# Patient Record
Sex: Female | Born: 1995 | Race: White | Hispanic: No | Marital: Single | Smoking: Never smoker
Health system: Southern US, Community
[De-identification: ages and names within clinical notes are randomized; demographics above are authoritative.]

## PROBLEM LIST (undated history)

## (undated) ENCOUNTER — Inpatient Hospital Stay: Payer: Self-pay

## (undated) DIAGNOSIS — E669 Obesity, unspecified: Secondary | ICD-10-CM

## (undated) DIAGNOSIS — M25473 Effusion, unspecified ankle: Secondary | ICD-10-CM

## (undated) DIAGNOSIS — L03119 Cellulitis of unspecified part of limb: Secondary | ICD-10-CM

## (undated) DIAGNOSIS — F1910 Other psychoactive substance abuse, uncomplicated: Secondary | ICD-10-CM

## (undated) DIAGNOSIS — R030 Elevated blood-pressure reading, without diagnosis of hypertension: Secondary | ICD-10-CM

---
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
## 2013-02-13 ENCOUNTER — Observation Stay: Payer: Self-pay

## 2013-02-13 LAB — COMPREHENSIVE METABOLIC PANEL
Albumin: 2.3 g/dL — ABNORMAL LOW (ref 3.8–5.6)
Alkaline Phosphatase: 95 U/L (ref 82–169)
Anion Gap: 6 — ABNORMAL LOW (ref 7–16)
BUN: 5 mg/dL — ABNORMAL LOW (ref 9–21)
Calcium, Total: 8.2 mg/dL — ABNORMAL LOW (ref 9.0–10.7)
Chloride: 110 mmol/L — ABNORMAL HIGH (ref 97–107)
Co2: 23 mmol/L (ref 16–25)
Creatinine: 0.52 mg/dL — ABNORMAL LOW (ref 0.60–1.30)
EGFR (African American): >60
Glucose: 91 mg/dL (ref 65–99)
Potassium: 3.2 mmol/L — ABNORMAL LOW (ref 3.3–4.7)
SGOT(AST): 16 U/L (ref 0–26)
SGPT (ALT): 13 U/L (ref 12–78)
Total Protein: 6.4 g/dL (ref 6.4–8.6)

## 2013-02-14 LAB — DIFFERENTIAL
Eosinophil #: 0.2 10*3/uL (ref 0.0–0.7)
Lymphocyte #: 2 10*3/uL (ref 1.0–3.6)
Lymphocyte %: 13.7 %
Neutrophil #: 11.2 10*3/uL — ABNORMAL HIGH (ref 1.4–6.5)

## 2013-02-14 LAB — URINALYSIS, COMPLETE
Blood: NEGATIVE
Glucose,UR: NEGATIVE mg/dL (ref 0–75)
Protein: NEGATIVE
RBC,UR: 13 /HPF (ref 0–5)
Specific Gravity: 1.015 (ref 1.003–1.030)
WBC UR: 43 /HPF (ref 0–5)

## 2013-02-14 LAB — CBC
HCT: 28.9 % — ABNORMAL LOW (ref 35.0–47.0)
MCV: 87 fL (ref 80–100)
RBC: 3.3 10*6/uL — ABNORMAL LOW (ref 3.80–5.20)
RDW: 12.9 % (ref 11.5–14.5)

## 2013-02-16 LAB — URINE CULTURE

## 2013-02-22 LAB — HM HIV SCREENING LAB: HM HIV Screening: NEGATIVE

## 2013-02-26 ENCOUNTER — Observation Stay: Payer: Self-pay | Admitting: Obstetrics & Gynecology

## 2013-02-26 LAB — URINALYSIS, COMPLETE
Bilirubin,UR: NEGATIVE
Glucose,UR: NEGATIVE mg/dL (ref 0–75)
Nitrite: POSITIVE
Ph: 6 (ref 4.5–8.0)
RBC,UR: 4 /HPF (ref 0–5)
Specific Gravity: 1.017 (ref 1.003–1.030)
Squamous Epithelial: 2
WBC UR: 3172 /HPF (ref 0–5)

## 2013-02-27 LAB — GC/CHLAMYDIA PROBE AMP

## 2013-04-15 ENCOUNTER — Observation Stay: Payer: Self-pay

## 2013-04-15 LAB — URINALYSIS, COMPLETE
Ketone: NEGATIVE
Nitrite: NEGATIVE
Ph: 7 (ref 4.5–8.0)
Protein: 100
RBC,UR: 12 /HPF (ref 0–5)
Specific Gravity: 1.009 (ref 1.003–1.030)
Squamous Epithelial: 1

## 2013-04-17 LAB — URINE CULTURE

## 2013-04-23 ENCOUNTER — Observation Stay: Payer: Self-pay | Admitting: Obstetrics & Gynecology

## 2013-04-23 LAB — PIH PROFILE
Anion Gap: 7 (ref 7–16)
BUN: 4 mg/dL — ABNORMAL LOW (ref 7–18)
Creatinine: 0.73 mg/dL (ref 0.60–1.30)
EGFR (African American): >60
EGFR (Non-African Amer.): >60
Glucose: 130 mg/dL — ABNORMAL HIGH (ref 65–99)
HCT: 27.7 % — ABNORMAL LOW (ref 35.0–47.0)
HGB: 9.3 g/dL — ABNORMAL LOW (ref 12.0–16.0)
MCV: 80 fL (ref 80–100)
Osmolality: 276 (ref 275–301)
Platelet: 178 10*3/uL (ref 150–440)
Potassium: 3.8 mmol/L (ref 3.5–5.1)
RBC: 3.45 10*6/uL — ABNORMAL LOW (ref 3.80–5.20)
RDW: 13.4 % (ref 11.5–14.5)
SGOT(AST): 12 U/L (ref 0–26)
Uric Acid: 4.2 mg/dL (ref 3.0–5.8)
WBC: 10.2 10*3/uL (ref 3.6–11.0)

## 2013-04-23 LAB — PROTEIN / CREATININE RATIO, URINE
Creatinine, Urine: 99.8 mg/dL (ref 30.0–125.0)
Protein, Random Urine: 102 mg/dL — ABNORMAL HIGH (ref 0–12)
Protein/Creat. Ratio: 1022 mg/gCREAT — ABNORMAL HIGH (ref 0–200)

## 2013-04-25 ENCOUNTER — Observation Stay: Payer: Self-pay | Admitting: Obstetrics and Gynecology

## 2013-04-25 LAB — PIH PROFILE
Anion Gap: 4 — ABNORMAL LOW (ref 7–16)
Calcium, Total: 8.1 mg/dL — ABNORMAL LOW (ref 9.0–10.7)
Chloride: 109 mmol/L — ABNORMAL HIGH (ref 98–107)
Co2: 26 mmol/L (ref 21–32)
EGFR (African American): >60
EGFR (Non-African Amer.): >60
HCT: 27.5 % — ABNORMAL LOW (ref 35.0–47.0)
MCV: 80 fL (ref 80–100)
Osmolality: 273 (ref 275–301)
RBC: 3.46 10*6/uL — ABNORMAL LOW (ref 3.80–5.20)
RDW: 13.1 % (ref 11.5–14.5)

## 2013-04-25 LAB — PROTEIN / CREATININE RATIO, URINE: Protein, Random Urine: 46 mg/dL — ABNORMAL HIGH (ref 0–12)

## 2013-04-28 ENCOUNTER — Inpatient Hospital Stay: Payer: Self-pay | Admitting: Obstetrics and Gynecology

## 2013-04-29 LAB — PIH PROFILE
Anion Gap: 9 (ref 7–16)
Calcium, Total: 8.8 mg/dL — ABNORMAL LOW (ref 9.0–10.7)
Chloride: 107 mmol/L (ref 98–107)
Co2: 23 mmol/L (ref 21–32)
Creatinine: 0.72 mg/dL (ref 0.60–1.30)
EGFR (African American): >60
MCHC: 33 g/dL (ref 32.0–36.0)
Osmolality: 275 (ref 275–301)
Platelet: 218 10*3/uL (ref 150–440)
SGOT(AST): 12 U/L (ref 0–26)
Sodium: 139 mmol/L (ref 136–145)
Uric Acid: 4.8 mg/dL (ref 3.0–5.8)

## 2013-04-29 LAB — PROTEIN / CREATININE RATIO, URINE
Creatinine, Urine: 114.4 mg/dL (ref 30.0–125.0)
Protein, Random Urine: 211 mg/dL — ABNORMAL HIGH (ref 0–12)

## 2013-04-29 LAB — GC/CHLAMYDIA PROBE AMP

## 2013-12-03 ENCOUNTER — Emergency Department: Payer: Self-pay | Admitting: Emergency Medicine

## 2013-12-03 LAB — URINALYSIS, COMPLETE
Bilirubin,UR: NEGATIVE
Glucose,UR: NEGATIVE mg/dL (ref 0–75)
KETONE: NEGATIVE
Nitrite: NEGATIVE
Ph: 6 (ref 4.5–8.0)
Protein: NEGATIVE
Specific Gravity: 1.018 (ref 1.003–1.030)
Squamous Epithelial: 8
WBC UR: 26 /HPF (ref 0–5)

## 2013-12-03 LAB — CBC
HCT: 38.8 % (ref 35.0–47.0)
HGB: 12.5 g/dL (ref 12.0–16.0)
MCH: 26.6 pg (ref 26.0–34.0)
MCHC: 32.2 g/dL (ref 32.0–36.0)
MCV: 83 fL (ref 80–100)
PLATELETS: 251 10*3/uL (ref 150–440)
RBC: 4.69 10*6/uL (ref 3.80–5.20)
RDW: 13.2 % (ref 11.5–14.5)
WBC: 11 10*3/uL (ref 3.6–11.0)

## 2013-12-03 LAB — COMPREHENSIVE METABOLIC PANEL
Albumin: 3.6 g/dL — ABNORMAL LOW (ref 3.8–5.6)
Alkaline Phosphatase: 80 U/L
Anion Gap: 6 — ABNORMAL LOW (ref 7–16)
BILIRUBIN TOTAL: 0.2 mg/dL (ref 0.2–1.0)
BUN: 18 mg/dL (ref 7–18)
CHLORIDE: 108 mmol/L — AB (ref 98–107)
Calcium, Total: 8.8 mg/dL — ABNORMAL LOW (ref 9.0–10.7)
Co2: 25 mmol/L (ref 21–32)
Creatinine: 0.81 mg/dL (ref 0.60–1.30)
Glucose: 92 mg/dL (ref 65–99)
OSMOLALITY: 279 (ref 275–301)
POTASSIUM: 3.7 mmol/L (ref 3.5–5.1)
SGOT(AST): 30 U/L — ABNORMAL HIGH (ref 0–26)
SGPT (ALT): 31 U/L (ref 12–78)
Sodium: 139 mmol/L (ref 136–145)
Total Protein: 8 g/dL (ref 6.4–8.6)

## 2013-12-03 LAB — CK TOTAL AND CKMB (NOT AT ARMC)
CK, Total: 88 U/L
CK-MB: 0.8 ng/mL (ref 0.5–3.6)

## 2013-12-13 LAB — CBC
HCT: 38 % (ref 35.0–47.0)
HGB: 12 g/dL (ref 12.0–16.0)
MCH: 26.1 pg (ref 26.0–34.0)
MCHC: 31.6 g/dL — AB (ref 32.0–36.0)
MCV: 83 fL (ref 80–100)
PLATELETS: 255 10*3/uL (ref 150–440)
RBC: 4.59 10*6/uL (ref 3.80–5.20)
RDW: 13.4 % (ref 11.5–14.5)
WBC: 16.1 10*3/uL — ABNORMAL HIGH (ref 3.6–11.0)

## 2013-12-13 LAB — COMPREHENSIVE METABOLIC PANEL
Albumin: 3.4 g/dL — ABNORMAL LOW (ref 3.8–5.6)
Alkaline Phosphatase: 86 U/L
Anion Gap: 9 (ref 7–16)
BUN: 16 mg/dL (ref 7–18)
Bilirubin,Total: 0.2 mg/dL (ref 0.2–1.0)
Calcium, Total: 8.6 mg/dL — ABNORMAL LOW (ref 9.0–10.7)
Chloride: 104 mmol/L (ref 98–107)
Co2: 25 mmol/L (ref 21–32)
Creatinine: 0.68 mg/dL (ref 0.60–1.30)
EGFR (African American): >60
EGFR (Non-African Amer.): >60
GLUCOSE: 98 mg/dL (ref 65–99)
OSMOLALITY: 277 (ref 275–301)
Potassium: 3.7 mmol/L (ref 3.5–5.1)
SGOT(AST): 21 U/L (ref 0–26)
SGPT (ALT): 29 U/L (ref 12–78)
Sodium: 138 mmol/L (ref 136–145)
Total Protein: 8.1 g/dL (ref 6.4–8.6)

## 2013-12-13 LAB — CK TOTAL AND CKMB (NOT AT ARMC)
CK, Total: 147 U/L
CK-MB: 1 ng/mL (ref 0.5–3.6)

## 2013-12-13 LAB — PREGNANCY, URINE: Pregnancy Test, Urine: NEGATIVE m[IU]/mL

## 2013-12-14 ENCOUNTER — Inpatient Hospital Stay: Payer: Self-pay | Admitting: Internal Medicine

## 2013-12-14 LAB — URINALYSIS, COMPLETE
Bacteria: NONE SEEN
Bilirubin,UR: NEGATIVE
GLUCOSE, UR: NEGATIVE mg/dL (ref 0–75)
Ketone: NEGATIVE
NITRITE: NEGATIVE
Ph: 5 (ref 4.5–8.0)
Protein: NEGATIVE
RBC,UR: 2 /HPF (ref 0–5)
Specific Gravity: 1.016 (ref 1.003–1.030)

## 2013-12-15 LAB — CBC WITH DIFFERENTIAL/PLATELET
Basophil #: 0 10*3/uL (ref 0.0–0.1)
Basophil %: 0.2 %
EOS PCT: 0 %
Eosinophil #: 0 10*3/uL (ref 0.0–0.7)
HCT: 38.4 % (ref 35.0–47.0)
HGB: 12.1 g/dL (ref 12.0–16.0)
Lymphocyte #: 1.1 10*3/uL (ref 1.0–3.6)
Lymphocyte %: 6.9 %
MCH: 26.3 pg (ref 26.0–34.0)
MCHC: 31.6 g/dL — ABNORMAL LOW (ref 32.0–36.0)
MCV: 83 fL (ref 80–100)
MONO ABS: 0.9 x10 3/mm (ref 0.2–0.9)
Monocyte %: 5.5 %
Neutrophil #: 13.6 10*3/uL — ABNORMAL HIGH (ref 1.4–6.5)
Neutrophil %: 87.4 %
Platelet: 186 10*3/uL (ref 150–440)
RBC: 4.61 10*6/uL (ref 3.80–5.20)
RDW: 13.6 % (ref 11.5–14.5)
WBC: 15.6 10*3/uL — AB (ref 3.6–11.0)

## 2013-12-15 LAB — URINE CULTURE

## 2013-12-15 LAB — BASIC METABOLIC PANEL
ANION GAP: 4 — AB (ref 7–16)
BUN: 8 mg/dL (ref 7–18)
CALCIUM: 8.3 mg/dL — AB (ref 9.0–10.7)
CHLORIDE: 109 mmol/L — AB (ref 98–107)
CREATININE: 0.91 mg/dL (ref 0.60–1.30)
Co2: 23 mmol/L (ref 21–32)
EGFR (African American): >60
Glucose: 98 mg/dL (ref 65–99)
Osmolality: 270 (ref 275–301)
Potassium: 3.8 mmol/L (ref 3.5–5.1)
SODIUM: 136 mmol/L (ref 136–145)

## 2013-12-15 LAB — CK: CK, Total: 52 U/L

## 2013-12-15 LAB — TSH: THYROID STIMULATING HORM: 1.04 u[IU]/mL

## 2013-12-16 LAB — BASIC METABOLIC PANEL
Anion Gap: 9 (ref 7–16)
BUN: 6 mg/dL — AB (ref 7–18)
CO2: 21 mmol/L (ref 21–32)
Calcium, Total: 8.1 mg/dL — ABNORMAL LOW (ref 9.0–10.7)
Chloride: 111 mmol/L — ABNORMAL HIGH (ref 98–107)
Creatinine: 0.73 mg/dL (ref 0.60–1.30)
EGFR (Non-African Amer.): >60
GLUCOSE: 89 mg/dL (ref 65–99)
Osmolality: 278 (ref 275–301)
Potassium: 3.8 mmol/L (ref 3.5–5.1)
SODIUM: 141 mmol/L (ref 136–145)

## 2013-12-16 LAB — CBC WITH DIFFERENTIAL/PLATELET
Basophil #: 0 10*3/uL (ref 0.0–0.1)
Basophil %: 0.3 %
EOS ABS: 0.2 10*3/uL (ref 0.0–0.7)
EOS PCT: 1.4 %
HCT: 37.8 % (ref 35.0–47.0)
HGB: 12.2 g/dL (ref 12.0–16.0)
LYMPHS PCT: 15.6 %
Lymphocyte #: 1.8 10*3/uL (ref 1.0–3.6)
MCH: 27 pg (ref 26.0–34.0)
MCHC: 32.3 g/dL (ref 32.0–36.0)
MCV: 84 fL (ref 80–100)
Monocyte #: 0.9 x10 3/mm (ref 0.2–0.9)
Monocyte %: 7.8 %
NEUTROS ABS: 8.5 10*3/uL — AB (ref 1.4–6.5)
NEUTROS PCT: 74.9 %
Platelet: 176 10*3/uL (ref 150–440)
RBC: 4.53 10*6/uL (ref 3.80–5.20)
RDW: 13.3 % (ref 11.5–14.5)
WBC: 11.3 10*3/uL — ABNORMAL HIGH (ref 3.6–11.0)

## 2013-12-16 LAB — HEMOGLOBIN A1C: Hemoglobin A1C: 5.6 % (ref 4.2–6.3)

## 2013-12-17 LAB — VANCOMYCIN, TROUGH: Vancomycin, Trough: 2 ug/mL — ABNORMAL LOW (ref 10–20)

## 2013-12-19 LAB — CULTURE, BLOOD (SINGLE)

## 2014-01-27 ENCOUNTER — Inpatient Hospital Stay: Payer: Self-pay | Admitting: Internal Medicine

## 2014-01-27 LAB — URINALYSIS, COMPLETE
Bacteria: NONE SEEN
Bilirubin,UR: NEGATIVE
Glucose,UR: NEGATIVE mg/dL (ref 0–75)
KETONE: NEGATIVE
Leukocyte Esterase: NEGATIVE
NITRITE: NEGATIVE
Ph: 7 (ref 4.5–8.0)
Protein: NEGATIVE
RBC,UR: 2 /HPF (ref 0–5)
Specific Gravity: 1.017 (ref 1.003–1.030)
Squamous Epithelial: 9
WBC UR: 5 /HPF (ref 0–5)

## 2014-01-27 LAB — COMPREHENSIVE METABOLIC PANEL
ALBUMIN: 3.6 g/dL — AB (ref 3.8–5.6)
ALT: 21 U/L (ref 12–78)
Alkaline Phosphatase: 73 U/L
Anion Gap: 6 — ABNORMAL LOW (ref 7–16)
BUN: 12 mg/dL (ref 7–18)
Bilirubin,Total: 0.4 mg/dL (ref 0.2–1.0)
CALCIUM: 8.7 mg/dL — AB (ref 9.0–10.7)
CHLORIDE: 106 mmol/L (ref 98–107)
CO2: 25 mmol/L (ref 21–32)
CREATININE: 0.78 mg/dL (ref 0.60–1.30)
EGFR (Non-African Amer.): >60
Glucose: 108 mg/dL — ABNORMAL HIGH (ref 65–99)
Osmolality: 274 (ref 275–301)
Potassium: 4 mmol/L (ref 3.5–5.1)
SGOT(AST): 17 U/L (ref 0–26)
Sodium: 137 mmol/L (ref 136–145)
TOTAL PROTEIN: 8 g/dL (ref 6.4–8.6)

## 2014-01-27 LAB — CK TOTAL AND CKMB (NOT AT ARMC)
CK, Total: 79 U/L
CK-MB: <0.5 ng/mL — ABNORMAL LOW (ref 0.5–3.6)

## 2014-01-27 LAB — CBC
HCT: 38.9 % (ref 35.0–47.0)
HGB: 12.2 g/dL (ref 12.0–16.0)
MCH: 25.9 pg — AB (ref 26.0–34.0)
MCHC: 31.4 g/dL — ABNORMAL LOW (ref 32.0–36.0)
MCV: 83 fL (ref 80–100)
Platelet: 282 10*3/uL (ref 150–440)
RBC: 4.7 10*6/uL (ref 3.80–5.20)
RDW: 14.8 % — ABNORMAL HIGH (ref 11.5–14.5)
WBC: 17 10*3/uL — AB (ref 3.6–11.0)

## 2014-01-28 LAB — CBC WITH DIFFERENTIAL/PLATELET
BASOS ABS: 0.1 10*3/uL (ref 0.0–0.1)
BASOS PCT: 0.5 %
EOS ABS: 0 10*3/uL (ref 0.0–0.7)
Eosinophil %: 0 %
HCT: 34.8 % — AB (ref 35.0–47.0)
HGB: 11.5 g/dL — ABNORMAL LOW (ref 12.0–16.0)
LYMPHS ABS: 1.4 10*3/uL (ref 1.0–3.6)
Lymphocyte %: 10 %
MCH: 27.2 pg (ref 26.0–34.0)
MCHC: 33.2 g/dL (ref 32.0–36.0)
MCV: 82 fL (ref 80–100)
Monocyte #: 0.9 x10 3/mm (ref 0.2–0.9)
Monocyte %: 6.5 %
Neutrophil #: 11.5 10*3/uL — ABNORMAL HIGH (ref 1.4–6.5)
Neutrophil %: 83 %
Platelet: 219 10*3/uL (ref 150–440)
RBC: 4.24 10*6/uL (ref 3.80–5.20)
RDW: 15.3 % — AB (ref 11.5–14.5)
WBC: 13.9 10*3/uL — ABNORMAL HIGH (ref 3.6–11.0)

## 2014-01-29 LAB — CBC WITH DIFFERENTIAL/PLATELET
BASOS ABS: 0.1 10*3/uL (ref 0.0–0.1)
BASOS PCT: 0.6 %
Eosinophil #: 0.1 10*3/uL (ref 0.0–0.7)
Eosinophil %: 0.5 %
HCT: 37.1 % (ref 35.0–47.0)
HGB: 11.6 g/dL — ABNORMAL LOW (ref 12.0–16.0)
Lymphocyte #: 1.4 10*3/uL (ref 1.0–3.6)
Lymphocyte %: 11.8 %
MCH: 26 pg (ref 26.0–34.0)
MCHC: 31.2 g/dL — ABNORMAL LOW (ref 32.0–36.0)
MCV: 83 fL (ref 80–100)
Monocyte #: 1.1 x10 3/mm — ABNORMAL HIGH (ref 0.2–0.9)
Monocyte %: 9.1 %
NEUTROS PCT: 78 %
Neutrophil #: 9 10*3/uL — ABNORMAL HIGH (ref 1.4–6.5)
PLATELETS: 213 10*3/uL (ref 150–440)
RBC: 4.46 10*6/uL (ref 3.80–5.20)
RDW: 15.1 % — AB (ref 11.5–14.5)
WBC: 11.6 10*3/uL — ABNORMAL HIGH (ref 3.6–11.0)

## 2014-02-01 LAB — CULTURE, BLOOD (SINGLE)

## 2014-04-02 ENCOUNTER — Emergency Department: Payer: Self-pay | Admitting: Emergency Medicine

## 2014-09-04 DIAGNOSIS — Z8632 Personal history of gestational diabetes: Secondary | ICD-10-CM | POA: Insufficient documentation

## 2014-09-04 DIAGNOSIS — M6282 Rhabdomyolysis: Secondary | ICD-10-CM | POA: Insufficient documentation

## 2014-11-11 NOTE — Consult Note (Signed)
PATIENT NAME:  [facility], G[facility] MR#:  [id number] DATE OF BIRTH:  03/18/1996  DATE OF CONSULTATION:  04/09/2012  REFERRING PHYSICIAN:  Shaune PollackQing Chen, MD   CONSULTING PHYSICIAN:  Gina PippinsMunsoor N. Brigitte Soderberg, MD  REASON FOR CONSULTATION: Severe rhabdomyolysis.   HISTORY OF PRESENT ILLNESS: The patient is a very pleasant Caucasian female without significant past medical history who presented to [facility] after somewhat vigorous activity performed during spinning class last Tuesday. The patient reports that she has been on an exercise regimen in an effort to increase her exercise stamina, as  she will be joining the [employer] after graduating from high [employer]. She has been normally active and has played softball recently. Her softball coach is present today and states that she has worked out vigorously in the past. The patient attended spinning class last Tuesday. She performed this activity for 30 minutes. Thereafter, she began experiencing progressive bilateral thigh pain. She reports that she went to [place] this past Friday and had difficulty driving a car with a clutch and also had significant difficulty moving around at [employer]. She also reports that she had a fall at [employer]. Subsequently, she came to [facility] for evaluation. Upon initial evaluation, the patient's CK was quite high at 42,108. It had fallen to 38,323 on 04/07/2012. On the 15th, her CK came back at 40,858. However, today her CK is much higher a5 92,154. She had a myoglobin level of 4858 yesterday. She continues to have bilateral thigh pain at this time. She is in the process of being transferred to [facility]. The patient has also been evaluated by [facility], who does not feel that muscle biopsy is needed at present. The patient is receiving IV fluid hydration with normal saline at 200 mL per hour. Yesterday, the patient had 3.8 liters of urine output.   PAST MEDICAL HISTORY: None.   PAST  SURGICAL HISTORY: History of cortisone injections for tendinitis.   CURRENT INPATIENT MEDICATIONS:  1. Normal saline at 200 mL/h. 2. Tramadol 50 mg p.o. every 6 hours p.r.n. pain.   SOCIAL HISTORY: The patient lives in [place]. 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 She denies tobacco, alcohol, or illicit drug use.   FAMILY HISTORY: The patient's mother is alive and well, no medical history. The patient's father has history of hypertension and possible underlying sleep apnea.    REVIEW OF SYSTEMS: CONSTITUTIONAL: Denies fevers, chills or fatigue. EYES: Denies diplopia, blurry vision. HEENT: Denies headaches, hearing loss. Denies epistaxis. CARDIOVASCULAR: Denies chest pain, palpitations, PND. RESPIRATORY: Denies cough, shortness of breath, or hemoptysis. GI: Denies nausea, vomiting, diarrhea, or bloody stools. GU: Denies frequency or urgency. She did have tea-colored urine earlier, but this appears to be clearing now. MUSCULOSKELETAL: Reports bilateral thigh pain. INTEGUMENTARY: Denies skin rashes or lesions. NEUROLOGIC: Denies focal numbness. Does have some weakness in her lower extremities along with pain in her thighs. PSYCHIATRIC: Denies depression, bipolar disorder. ENDOCRINE: Denies polyuria, polydipsia, or polyphagia. HEMATOLOGIC/LYMPHATIC: Denies easy bruisability, bleeding, or swollen lymph nodes. ALLERGY/IMMUNOLOGIC: Denies seasonal allergies or history of immunodeficiency.   PHYSICAL EXAMINATION:  VITAL SIGNS: Temperature 98.2, pulse 87, respirations 18, blood pressure 120/80, pulse oximetry 99%.   GENERAL: A well-developed, well-nourished Caucasian female who appears her stated age, currently in no acute distress.   HEENT: Normocephalic, atraumatic. Extraocular movements are intact. Pupils are equal, round, and reactive to light. No scleral icterus. Conjunctivae are pink. No epistaxis noted. Gross hearing intact. Oral mucosa moist.  NECK: Supple without JVD or lymphadenopathy.    LUNGS: Clear to auscultation bilaterally with normal respiratory effort.   HEART: S1 and S2, regular rate and rhythm. No murmurs, rubs, or gallops appreciated.   ABDOMEN: Soft, nontender, nondistended. Bowel sounds positive. No rebound or guarding. No gross organomegaly appreciated.   EXTREMITIES: No clubbing, cyanosis, or edema noted in the patient's lower extremities, including the calves.   NEUROLOGIC: The patient is alert and oriented to time, person, and place. Strength is five out of five in both upper extremities as she found it a bit difficult to raise her legs off the bed.   SKIN: Warm and dry. No obvious rashes noted.   MUSCULOSKELETAL: Bilateral tenderness to palpation of both thighs. There is mild swelling in the left eye as compared to the right.   PSYCHIATRIC: The patient has an appropriate affect and appears to have good insight into her current illness.   LABORATORY, DIAGNOSTIC AND RADIOLOGICAL DATA: Sodium 144, potassium 3.6, chloride 110, CO2 26, BUN 7, creatinine 0.6, glucose 88. CK is 92,154. LFTs show total protein 6.5, albumin 2.9, total bilirubin 0.3, alkaline phosphatase 57, AST 1672, ALT 425. Urinalysis shows urine protein of 100 mg/dL, negative nitrites, negative leukocyte esterase, 102 RBCs per high-power field. No WBCs. Urine bilirubin was actually negative. Serum myoglobin is 4858.   IMPRESSION: The patient is an 19 year old healthy Caucasian female who presented to [facility] after exercising vigorously in spinning class last Tuesday and now presents with severe rhabdomyolysis with a CK of 92,154.   PLAN: Exercise-induced rhabdomyolysis: I agree with [facility] impression that this is most likely exercise-induced rhabdomyolysis. This appears to be quite severe at this time with a. CK of 92,154. The patient certainly is at risk for rhabdomyolysis-induced acute kidney injury. However, the patient appears to be making adequate amounts of  urine per review of urine output from yesterday. Thus far, the patient has had 1000 mL of urine output since about 7:00 a.m. today. I agree with vigorous IV fluid hydration. I would suggest increasing 0.9 normal saline to 250 mL/h to maintain an adequate urine output between 200 mL/h to 300 mL/h. I agree with [facility] assessment it may take some time for the CK levels to come down. We would certainly monitor electrolytes closely, including serum potassium, bicarbonate, and calcium. However, it appears that the patient will likely be transferred to [facility] later today. There are certainly nephrologists available there from the [facility], and they can most likely assist with the patient's care there.   I would like to thank Dr. Imogene Burn for this kind referral.  ____________________________ Gina Pippins, MD mnl:cbb D: 04/09/2012 15:06:25 ET T: 04/09/2012 15:32:55 ET JOB#: [id number] Dandra Velardi N Caden Fukushima MD ELECTRONICALLY SIGNED 05/01/2012 23:29

## 2014-11-11 NOTE — Consult Note (Signed)
PATIENT NAME:  [facility], [facility]ina [facility] MR#:  [id number] DATE OF BIRTH:  Apr 12, 1996  DATE OF CONSULTATION:  04/09/2012  REFERRING PHYSICIAN:  Dr. Imogene Burn CONSULTING PHYSICIAN:  Kandyce Rud., MD  REASON FOR CONSULTATION: Elevated CPK.   HISTORY OF PRESENT ILLNESS: 19 year old white female. She has history of normal muscular activity and normal exercise capacity. She played softball and volleyball in high school. She did have trouble with her feet and was seen by podiatry at [facility] on more than on occasion, was told she had tendinitis, had cortisone injections on top of the feet. She has been working out regularly with mom in a class at [employer] called "spin". 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 She had a usual evening on Friday evening, a usual evening on Tuesday morning then went to the exercise, did 30 minute leg work mainly on the exercise bike. She had some aching and stiffness and soreness the next couple of days but by Friday she had severe pain in both thighs and developed Pepsi colored urine which became tea colored. She came to the Emergency Room with pain in her thighs and CPK was 42,000. She has been given IV hydration. She had 2500 mL out on the 14th, 3800 mL out on the 15th, today has had 800. CPK today has gone to 92,000. Still has a good deal of pain in the quads and the upper thighs. Other muscles did not hurt such as calves, buttocks, shoulders. She denies any drugs. There is no family history of muscular dystrophy or MS. She denies any illicit drugs. She does take birth control pills. She has some pain in her thighs when she gets up. No shortness of breath. Creatinine has been stable, most recent at 0.6, depressed albumin. SGPT and SGOT are elevated. She has no abdominal pain. No shortness of breath. No dysphagia. She has not had any rash. She has not been photosensitive.   PAST  MEDICAL HISTORY: Negative.   PAST SURGICAL HISTORY: Positive for feet tendinitis.   SOCIAL HISTORY: No cigarettes or alcohol.   FAMILY HISTORY: Negative for muscle disease or connective tissue disease.   REVIEW OF SYSTEMS: As above.   PHYSICAL EXAMINATION:  GENERAL: Pleasant female, hurts to palpate her thighs but otherwise no acute distress.   VITAL SIGNS: Blood pressure 106/69, pulse 56, temperature 97, normal oxygen saturation.   SKIN: Without rashes, particularly no telangiectasias. No rash over the hands. No rash over the face or the chest.   HEENT: Sclerae clear.   NECK: Good carotid upstroke. No thyromegaly.   CHEST: Clear.   ABDOMEN: Nontender.   EXTREMITIES: Trace lower extremity edema.   MUSCULOSKELETAL: Good range of motion of neck and shoulders. Upper extremities without synovitis. Hips move well without synovitis. Knees without effusions. Ankles and toes without synovitis.   NEUROLOGIC: She has 5/5 power biceps, triceps, and grip, neck flexors. She has very tender bilateral thighs. The hamstring is not particularly tender. Calves are nontender. She can raise her legs against gravity and has 5/5 dorsi and plantar flexion on her feet. Symmetric knee and ankle jerks.   IMPRESSION:  Exercise induced rhabdomyolysis, doubt drug-related or metabolic or inflammatory. CPK may be worsened today because of decreased urine output   PLAN:  1. Would recommend continued vigorous hydration shooting toward up to about 3 liters of urine output a day. Suspect it will take a  long time to clear her CPK.      2. Fall creatinine to make sure she does not develop any renal insufficiency for myoglobinuria though she has cleared the color of her urine. If she were to have repeated outpatient episodes then we can evaluate as an outpatient but I do not think at this point there is any indication for muscle biopsy or steroids.   ____________________________ Kandyce RudGeorge W. Kernodle Jr.,  MD gwk:cms D: 04/09/2012 13:35:06 ET T: 04/09/2012 13:55:43 ET JOB#: [id number]  cc: Kandyce RudGeorge W. Kernodle Jr., MD, <Dictator> Dione HousekeeperMario Ernesto Olmedo, MD  Webb SilversmithGEORGE W KERNODLE J MD ELECTRONICALLY SIGNED 04/10/2012 13:10

## 2014-11-11 NOTE — Consult Note (Signed)
PATIENT NAME:  [facility], [facility] MR#:  [id number] DATE OF BIRTH:  March 04, 1996  DATE OF CONSULTATION:  04/09/2012  REFERRING PHYSICIAN:   CONSULTING PHYSICIAN: Lutricia FeilPaul Oh, MD / Rodman Keyawn S. Devynn Scheff, NP  PRIMARY CARE PHYSICIAN: Rolin BarryMario Olmedo, MD  REASON FOR CONSULTATION: Elevation in LFTs.   HISTORY OF PRESENT ILLNESS: G[facility] is an 19 year old Caucasian female with essentially unremarkable past medical history as well as surgical history. Family history is significant for a first degree cousin who underwent liver transplant at the age of 21, deceased at the age of 21. 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 The patient presented to the Emergency Room on 04/06/2012 for the concern of inability to ambulate as well as to drive her car due to severe pain to her legs, particularly her thighs. She has been attempting to get in shape as she wishes to join the [employer] when she graduates from high school. On Wednesday she had ridden a stationary bike for 30 minutes. The day following her legs were sore but not to the extent of what her symptoms were on Friday. In the Emergency Room, she was diagnosed with rhabdomyolysis with a CPK reaching 42,000. The patient has not experienced any nausea or vomiting, no abdominal pain. Bowels are moving on average every day. Yesterday hot flashes and chills which started after lunch but resolved. Does experience acid reflux at times, but is not taking anything on a regular basis. She has had an intentional 10 pound weight loss over the past 2 to 3 months, and again attempting to get in shape. Urine was dark brown on Friday "Pepsi" color, but that has resolved as well since being hospitalized. Currently her knees are sore with ambulation and thighs are painful at times as well.   SOCIAL HISTORY: History was reviewed in detail. She does have three tattoos.  No history of blood transfusion in the past. Only  heterosexual relationships. She denies taking any over the counter antiinflammatories or Tylenol. She was placed on oral contraceptive therapy in April of this year. Current menses is now.   PAST MEDICAL HISTORY: Unremarkable.   PAST SURGICAL HISTORY: Unremarkable.   FAMILY HISTORY: Both parents are healthy. First degree cousin with history of hepatitis, questionable autoimmune, at the age of 21 and underwent liver transplant and deceased at the age of 21, maternal side of the family. No cancer in the family.   SOCIAL HISTORY: No tobacco. No alcohol or recreational drug use. Resides with her parents. Senior at [employer].   REVIEW OF SYSTEMS: CONSTITUTIONAL: Denies any fevers. Significant for chills, sweats and slight fatigue. HEENT: No blurred vision or double vision. ENT: No hearing impairment. No sore throat or dysphagia. CARDIOVASCULAR: No chest pain or shortness of breath. Denies syncope. RESPIRATORY: No shortness of breath, wheezing. GI: See History of Present Illness. GU: Evidence of noted color change, very dark, but otherwise no dysuria or evidence of hematuria. MUSCULOSKELETAL: See History of Present Illness.  INTEGUMENTARY: No rashes. No lesions. No jaundice. NEUROLOGIC: Mild headache in correlation with these symptoms, but no history of seizure activity or transient ischemic attack. PSYCH: No depression. No anxiety. ENDOCRINE: No polyuria, polydipsia, or heat or cold intolerance.   PHYSICAL EXAMINATION:   VITALS: Temperature 98.2, pulse 87, respirations 18, blood pressure 120/80, and pulse oximetry 99%.   GENERAL: Well developed, well nourished 19 year old Caucasian female, in no acute distress noted, pleasant, resting comfortably in bed, friends at bedside, eating [employer].  HEENT: Normocephalic, atraumatic. Pupils equal and reactive to light. Conjunctivae clear. Sclerae anicteric.   NECK: Supple. Trachea midline. No lymphadenopathy.   PULMONARY: Symmetric rise and fall  of chest. Clear to auscultation throughout.   CARDIOVASCULAR: Regular rate and rhythm, S1 and S2. No murmurs. No gallops.   ABDOMEN: Soft and nondistended. Bowel sounds in four quadrants. No bruits. No masses.   RECTAL: Deferred.   MUSCULOSKELETAL: Moving all four extremities. No contractures. No clubbing.   NEUROLOGICAL: No gross neurological deficits.   LABORATORY, DIAGNOSTIC AND RADIOLOGIC DATA: Chemistry panel was unremarkable on admission, except CO2 was elevated at 27. Calcium was low at 8.8, which has dropped yesterday to level of 8.1. Sodium has risen to 144 from a level of 141 on admission. CO2 remains elevated at 26. Hepatic panel drawn yesterday: Albumin 2.9, alkaline phosphatase 57, and AST 1672 with an ALT of 425. Total bilirubin was 0.3. CK total was 42,108 on admission and dropped to 32,323 and has risen to 40,858 yesterday and today is 92,154 with a CK-MB of 71.1. CBC on admission was normal. Sedimentation rate was 10.   Urinalysis revealed +3 blood, protein 100 mg/dL, and RBCs 960 per high-power field. Currently the patient is on her menses.   Hepatitis profile I panel was unremarkable which is inclusive of hepatitis A, antibody IgM, hepatitis B surface antigen screening, and hepatitis B core antibody IgM.  Myoglobulin is elevated at 4858. Acetaminophen level is less than 2. Lactic acid cardiopulmonary on admission was 0.8.  An abdominal ultrasound done today due to elevation in transaminase levels is normal appearing abdominal sonogram.   IMPRESSION: Admitted for rhabdomyolysis, elevation in transaminase levels, noted hepatic panel yesterday revealing elevation in transaminase levels, AST greater than ALT. Differential diagnosis inclusive whether drug induced with prescription for over-the-counter pending drug tox panel, viral autoimmune process and hepatitis as well as toxin recently ingested such as again being started on oral contraceptive therapy in April 2013. Possible  congenital condition is an underlying etiological cause as well.   PLAN: The patient's presentation was discussed with Dr. Lutricia Feil. In thorough discussion with Dr. Imogene Burn, [facility] hospitalist, the patient is going to be transferred to [facility] due to insurance coverage. Do recommend further evaluation via laboratory studies. Would recommend the consideration of GI consultation being done as well at that facility based on current laboratory results as well as recommendations for differentials needs to be further evaluated.      These services provided by Rodman Key, MS, APRN, [facility], FNP under collaborative agreement with Dr. Lutricia Feil. ____________________________ Rodman Key, NP dsh:slb D: 04/09/2012 14:22:59 ET T: 04/09/2012 14:49:12 ET JOB#: [id number]  cc: Rodman Key, NP, <Dictator>  Rodman Key MD ELECTRONICALLY SIGNED 04/11/2012 8:20

## 2014-11-11 NOTE — Consult Note (Signed)
Brief Consult Note: Patient was seen by consultant.   Consult note dictated.   Discussed with Attending MD.   Comments: exercise induced rhabdomyolysis, doubt drugs, metabolic myopathy, polymyositis, or hypothyroid.  still with high cpk, muscle pain  but urine now nl colored, creatinine... agree rigorous hydration aiming for 3 liters urine output qd (now at 800 cc, IV out) if pt were to have other episodes , then could reassess as outpt.  Electronic Signatures: Royann ShiversKernodle, Jr., Helen HashimotoGeorge Wallace (MD)  (Signed 16-Sep-13 13:29)  Authored: Brief Consult Note   Last Updated: 16-Sep-13 13:29 by Royann ShiversKernodle, Jr., Helen HashimotoGeorge Wallace (MD)

## 2014-11-11 NOTE — Consult Note (Signed)
Brief Consult Note: Diagnosis: Admitted for rhabdomylosis.  Elevation of transminase levels.  Noted heaptic panel yesterday.  Differential inclusive of drug induced whether prescriptive or OTC.  Pending drug toxic panel.  Viral, autoimmune process, hepatitis, toxin recently started on OC April 2013.  Possible congenital conditions.   Consult note dictated.   Comments: Patient's presentation discussed with Dr. Lutricia FeilPaul Oh.  Recommendation to proceed with additional evaluation based on finding of elevation of LTt as well as family history of hepatic condition.  [facility] being [facility] thus recommended continued evalution at this facility.  Electronic Signatures: Rodman KeyHarrison, Quamesha Mullet S (NP)  (Signed 16-Sep-13 14:24)  Authored: Brief Consult Note   Last Updated: 16-Sep-13 14:24 by Rodman KeyHarrison, Lennis Korb S (NP)

## 2014-11-11 NOTE — Discharge Summary (Signed)
PATIENT NAME:  [facility], [facility] MR#:  [id number] DATE OF BIRTH:  [birth year]  DATE OF ADMISSION:  04/06/2012 DATE OF DISCHARGE:  04/09/2012  DISCHARGE DIAGNOSES:  1. Rhabdomyolysis. 2. Abnormal liver function tests.   PRIMARY CARE PHYSICIAN: Valera Castle, MD   CONSULTATIONS:   1. Rheumatology, G. Fayrene Fearing., MD   2. GI, Verdie Shire, MD    REASON FOR ADMISSION: Bilateral leg pain.   REASON FOR TRANSFER: Insurance issues.   HOSPITAL COURSE: The patient is an 19 year old Caucasian female with no past medical history who presented to the ED with bilateral leg pain after exercise for 30 minutes. The patient was trying to join the [employer] and was involved in a stationary bike exercise for 30 minutes two days prior to this admission. The following day she was sore in bilateral legs, particularly in the thighs; however, the next day the thigh pain got worse to the extent that it was affecting her ability to walk. She came to the ED for further evaluation and was noted to have an elevated CK at 42,000 and was admitted for further evaluation and treatment.   On the admission date, the patient's laboratories showed glucose 87, BUN 14, creatinine 0.8, sodium 141, potassium 4, total CK 42,108. CBC showed white count 10,000, hemoglobin 13, hematocrit 40, platelet count elevated at 236.0. A urinalysis showed 3+ blood with 102 red blood cells, no white cells. The patient was admitted for rhabdomyolysis. After admission, the patient has been treated with normal saline IV at 200 mL/h for the past three days. However, the patient still has bilateral thigh  pain. CK increased to about 92,000 today. Complete metabolic panel showed SGPT was 422, SGOT was 1672, total protein 6.5, albumin 2.9. The patient's  lactic acid is 0.9. Myoglobin serum is 4858, and ESR is 10.0. Acetaminophen level was less than 2.0. CK 71.1. Abdominal ultrasound shows normal-appearing abdominal sonogram. Hepatitis final is  negative.  According to Rheumatology consult from Dr. Jefm Bryant, the patient has rhabdomyolysis, needs vigorous hydration aiming for 3 liters urine output daily and monitor CK. According to GI consult from Dr. Myrna Blazer PA, the patient needs an autoimmune hepatitis work-up and IgG4 panel; however, according to the case manager, the patient needs to be [facility] transferred to [place] due to an insurance issue. I called [facility] and had contact with Dr. Bing Ree, who said there is no bed available in [facility] at this time. However, [facility] will accept the patient. The patient is medically stable and will be transferred to [facility] today.   I discussed the patient's situation and transfer plan with the patient and the patient's mother, case manager, charting nurse, and nurse.   TIME SPENT: About 48 minutes.  ____________________________ Demetrios Loll, MD qc:cbb D: 04/09/2012 14:03:59 ET T: 04/09/2012 14:37:12 ET JOB#: [id number]  cc: Demetrios Loll, MD, <Dictator> Valera Castle, MD Demetrios Loll MD ELECTRONICALLY SIGNED 04/09/2012 16:08

## 2014-11-11 NOTE — Consult Note (Signed)
Pt seen and examined. Please see [facility]'s notes. Pt with rhabdomyolysis. LFT high, which could be from rhabdo. Denies recent meds/toxins, etc. Tylenol and urine tox screen ordered. Hepatitis panel neg. Cousin with similar problem who ended up with liver transplant. R/O AIH, Wilson, or other hereditary/congenital liver issues. [facility] to [facility]. Thanks.   Electronic Signatures: Lutricia Feilh, Araeya Lamb (MD)  (Signed on 16-Sep-13 16:09)  Authored  Last Updated: 16-Sep-13 16:09 by Lutricia Feilh, Keilah Lemire (MD)

## 2014-11-11 NOTE — H&P (Signed)
PATIENT NAME:  Gina Campbell, Gina Campbell MR#:  [id number] DATE OF BIRTH:  1995-12-17  DATE OF ADMISSION:  04/06/2012  PRIMARY CARE PHYSICIAN: Dr. Rolin Barry  REFERRING PHYSICIAN: Dr. Daryel November   CHIEF COMPLAINT: Bilateral leg pain.   HISTORY OF PRESENT ILLNESS: Gina Campbell is an 19 year old pleasant Caucasian female with healthy past medical history. She was trying to join the [employer] and she was involved in a stationary bike exercise for 30 minutes, that was two days ago on Wednesday. The following day she was sore in her legs, in particular the thighs. However, the next day the pains got worse to the extent it was affecting her ability to walk or drive her car. She was brought to the Emergency Department for evaluation and here work-up was consistent with significant rhabdomyolysis with CPK reaching 42,000. The patient was admitted for further evaluation and treatment.   REVIEW OF SYSTEMS: CONSTITUTIONAL: Denies any fever. No chills. A little fatigue. EYES: No blurring of vision. No double vision. ENT: No hearing impairment. No sore throat. No dysphagia. CARDIOVASCULAR: No chest pain. No shortness of breath. No edema. No syncope. RESPIRATORY: No shortness of breath. No cough. No chest pain. GASTROINTESTINAL: No abdominal pain. No vomiting. No diarrhea. GENITOURINARY: No dysuria. No frequency of urination. She just started her menstrual period. MUSCULOSKELETAL: No joint pain but she has muscular pain especially in her thighs.  No muscle swelling. No ulcers. INTEGUMENT: No skin rash. No ulcers. NEURO: No focal weakness. No seizure activity. No syncope. She reports some mild headache that just started.  PSYCH: No anxiety. No depression. ENDOCRINE: No polyuria or polydipsia. No heat or cold intolerance.   PAST MEDICAL HISTORY: Healthy.   PAST SURGICAL HISTORY: Negative.   FAMILY HISTORY: Both parents are healthy and there is no other significant illness for her extended family.   SOCIAL  HABITS: Nonsmoker. No history of alcohol or drug abuse.   SOCIAL HISTORY: She lives with her parents.  She is a Holiday representative in high school.   ADMISSION MEDICATIONS: None.   ALLERGIES: No known drug allergies.   PHYSICAL EXAMINATION:  VITAL SIGNS: Blood pressure 153/96, pulse 110, respiratory rate 18, temperature 98.8, oxygen saturation is 98%.   GENERAL APPEARANCE: Young female lying in bed in no acute distress.   HEAD: No pallor. No icterus. No cyanosis.   ENT: Hearing was normal. Nasal mucosa, lips, and tongue were normal.   EYES: Normal eyelids and conjunctivae. Pupils about 6 mm, equal and reactive to light.   NECK: Supple. Trachea at midline. No thyromegaly. No cervical lymphadenopathy. No masses.   HEART: Normal S1, S2, no S3 or S4. No murmur. No gallop. No carotid bruits.   RESPIRATORY: Normal breathing pattern without use of accessory muscles. No rales. No wheezing.   ABDOMEN: Soft without tenderness. No hepatosplenomegaly. No masses. No hernias.   SKIN: No ulcers. No subcutaneous nodules.   MUSCULOSKELETAL: No joint swelling. No clubbing. There is tenderness upon palpation on both thighs. No peripheral edema.   NEUROLOGIC: Cranial nerves II through XII are intact. No focal motor deficit.   PSYCHIATRIC: The patient is alert and oriented times three. Mood and affect were normal.   LABORATORY, DIAGNOSTIC, AND RADIOLOGICAL DATA: Serum glucose 87, BUN 14, creatinine 0.8, sodium 141, potassium 4.  Total CPK was 42,108. CBC showed white count 10,000, hemoglobin 13, hematocrit 40, platelet count 236. Urinalysis showed +3 blood with 102 red blood cells. No white blood cells. Again, the patient is menstruating now.   ASSESSMENT:  1. Rhabdomyolysis. 2. Bilateral thigh pain secondary to rhabdomyolysis.  3. Elevated blood pressure needs to be monitored to reach the conclusion as to whether or not she has underlying hypertension or if it related to this event.  4. Mild headache, may be  related to her elevated blood pressure.   PLAN: We will admit the patient to the medical floor. Start IV hydration with normal saline. Tylenol p.r.n. Monitor kidney function and CPK level. Monitor blood pressure. I will give her one dose of amlodipine 2.5 mg.   TIME SPENT EVALUATING THIS PATIENT: More than 40 minutes.     ____________________________ Carney CornersAmir M. Rudene Rearwish, MD amd:bjt D: 04/06/2012 23:13:50 ET T: 04/07/2012 07:46:47 ET JOB#: [id number]  cc: Carney CornersAmir M. Rudene Rearwish, MD, <Dictator> Dione HousekeeperMario Ernesto Olmedo, MD Karolee OhsAMIR Dala DockM Scarleth Brame MD ELECTRONICALLY SIGNED 04/07/2012 22:29

## 2014-11-11 NOTE — Consult Note (Signed)
Brief Consult Note: Comments: Dr. Bluford Kaufmannh will see the patient today. Thanks.  Electronic Signatures: Lurline DelIftikhar, Bralin Garry (MD)  (Signed 16-Sep-13 09:26)  Authored: Brief Consult Note   Last Updated: 16-Sep-13 09:26 by Lurline DelIftikhar, Rameen Quinney (MD)

## 2014-11-14 NOTE — Op Note (Signed)
PATIENT NAME:  Gina Campbell, Gina Campbell MR#:  [id number] DATE OF BIRTH:  1996/07/07  DATE OF PROCEDURE:  04/30/2013  PREOPERATIVE DIAGNOSES:  1.  G1 at 39 weeks 6 days' gestation.  2.  Fetal intolerance to labor.  3.  Meconium.  4.  Preeclampsia without severe features.   POSTOPERATIVE DIAGNOSES:  1.  G1 at 39 weeks 6 days' gestation.  2.  Fetal intolerance to labor.  3.  Meconium.  4.  Preeclampsia without severe features.  5.  Nuchal cord x1.   PROCEDURE PERFORMED: Primary low transverse cesarean section via Pfannenstiel skin incision.   PRIMARY SURGEON: Florina Ou. Bonney Aid, MD.   ASSISTANT: Farrel Conners, CNM.   ESTIMATED BLOOD LOSS: 700 mL.   OPERATIVE FLUIDS: 1 liter.   DRAINS OR TUBES: Foley to gravity drainage, On-Q catheter system.   IMPLANTS: None.   COMPLICATIONS: None.   SPECIMENS REMOVED: None.   FINDINGS: Normal tubes, ovaries, and uterus.   Delivery resulted in the birth of a liveborn female infant weighing 3660 grams, 8 pounds 1 ounce. Apgars 8 and 9 with a nuchal cord x1 noted at time of delivery.   PATIENT CONDITION FOLLOWING PROCEDURE: Stable.   PROCEDURE IN DETAIL: Risks, benefits, and alternatives of the procedure were discussed with the patient prior to proceeding to the operating room. The patient was taken to the operating room where her epidural anesthesia was dosed. She was then positioned in the supine position, prepped and draped in the usual sterile fashion. A timeout procedure was performed and the level of anesthetic was checked and noted to be adequate prior to proceeding with the case.   A scalpel was used to make a Pfannenstiel skin incision 2 cm above the pubic symphysis, which was carried down sharply to the level of the rectus fascia. The fascia was incised in the midline with the scalpel then extended using Mayo scissors. The superior border of the rectus fascia was grasped with 2 Kocher clamps. The underlying rectus muscles were dissected  off the rectus fascia using blunt dissection. The median raphe was incised using Mayo scissors. The inferior border of the rectus muscles was also dissected off the rectus fascia in a similar fashion. The midline was then identified. The peritoneum was entered bluntly. The peritoneal incision was extended using manual traction. A bladder blade was placed. A bladder flap was then developed using Metzenbaum scissors and further dissected bluntly using the operator's finger. The bladder blade was then replaced, displacing the bladder caudally.   A low transverse incision was made on the uterus using the scalpel. The hysterotomy incision was entered bluntly using the operator's finger and then extended using manual traction. Upon placing the operator's hand into the hysterotomy incision, the infant was noted to be in the OA position. The vertex was grasped, flexed, brought to the incision, delivered atraumatically using fundal pressure. Nuchal cord x1 was noted which was reduced without difficulty. The infant began crying spontaneously and was suctioned. The remainder of the body delivered with ease.   The infant was passed to the awaiting pediatricians. The cord blood was obtained. The placenta was delivered using manual extraction. The uterus was then exteriorized, wiped clean of clots and debris using 2 moist laps. The hysterotomy incision was closed using a 2-layer closure of 0 Vicryl with the first being a running lock, the second a vertical imbricating. The hysterotomy incision was inspected and noted to be hemostatic. The uterus was returned to the abdomen. The peritoneal gutters were wiped clean  of clots and debris using 2 moist laps. The peritoneum was closed using 2-0 Vicryl in a running fashion. The On-Q catheter system was then placed subfascially per the usual protocol. The fascia was closed using a looped #1 PDS in a running fashion. Subcutaneous tissue was irrigated and hemostasis achieved using the  Bovie. The skin was closed using an Insorb staples. Each On-Q catheter was then bolused with 5 mL of 0.5% bupivacaine each. Sponge, needle, and instrument counts were correct x2. The patient tolerated the procedure well and was taken to the recovery room in stable condition.    ____________________________ Florina OuAndreas M. Bonney AidStaebler, MD ams:np D: 04/30/2013 18:34:17 ET T: 04/30/2013 19:17:53 ET JOB#: [id number]  cc: Florina OuAndreas M. Bonney AidStaebler, MD, <Dictator> Lorrene ReidANDREAS M Ashelynn Marks MD ELECTRONICALLY SIGNED 05/08/2013 21:59

## 2014-11-15 NOTE — Discharge Summary (Signed)
PATIENT NAME:  Gina Campbell, Gina Campbell MR#:  [id number] DATE OF BIRTH:  1996/07/07  DATE OF ADMISSION:  12/14/2013 DATE OF DISCHARGE:  12/18/2013  PRIMARY CARE PHYSICIAN: Dr. Cliffton AstersWhite.   CONSULTING PHYSICIAN:  ID, Dr. Sampson GoonFitzgerald.   DISCHARGE DIAGNOSIS:  Right lower extremity cellulitis.   CONDITION: Stable.   CODE STATUS: Full code.   HOME MEDICATIONS: Keflex 500 mg p.o. b.i.d. for 7 days.   DIET: Regular diet.   ACTIVITY: As tolerated.   FOLLOWUP CARE:  1.  Primary care physician within 1 to 2 weeks.  2.  Follow up with Dr. Sampson GoonFitzgerald within 1 week.   REASON FOR ADMISSION: Right leg pain.   HOSPITAL COURSE: The patient is a 19 year old Caucasian female with a history of rhabdomyolysis in the past and GERD, presented to  the ED with the right leg pain, erythema and swelling up to knee. The patient's heart rate was 120s to 130s. Her temperature was 101. For detailed history and physical examination, please refer to the admission note dictated by Dr. Amado CoeGouru.   On admission date, the patient's laboratory data showed WBC 16.1, hemoglobin was normal. CK 147.  Urine glucose negative. Pregnancy test negative. Venous duplex did not show any DVT.   1.  Sepsis, with right .lower extremity cellulitis. After admission, the patient got blood culture, which was negative. The patient has been treated with vancomycin, clindamycin and Rocephin. Dr. Sampson GoonFitzgerald evaluated the patient, discontinued the vancomycin since patient's erythema and swelling of the right lower extremity has much improved. Dr. Sampson GoonFitzgerald suggested change to Keflex p.o. for 7 days after discharge.  2. The patient is clinically stable, will be discharged home today. Discussed the patient's discharge plan with the patient, nurse, case manager.   TIME SPENT: About 35 minutes.   ____________________________ Shaune PollackQing Deontez Klinke, MD qc:aj D: 12/18/2013 16:55:11 ET T: 12/19/2013 02:02:51 ET JOB#: [id number]  cc: Shaune PollackQing Hazen Brumett, MD, <Dictator> Shaune PollackQING Dontel Harshberger  MD ELECTRONICALLY SIGNED 12/19/2013 15:20

## 2014-11-15 NOTE — Discharge Summary (Signed)
PATIENT NAME:  Gina Campbell, Gina Campbell MR#:  [id number] DATE OF BIRTH:  06-26-1996  DATE OF ADMISSION:  01/27/2014 DATE OF DISCHARGE:  01/31/2014  ADMITTING DIAGNOSIS: Right lower extremity pain and swelling.   DISCHARGE DIAGNOSES: 1. Right lower extremity pain and swelling and due to recurrent cellulitis.  2. Recurrent lower extremity swelling, possibly due to lymphedema, possibly due to venous insufficiency. Will need outpatient vascular evaluation.  3. Gastroesophageal reflux disease.  4. History of rhabdomyolysis.  5. History of right lower extremity cellulitis in May 2015.   CONSULTANTS: Dr. Sampson GoonFitzgerald.   PERTINENT LABORATORY DATA AND EVALUATIONS: Admitting glucose 108, BUN 12, creatinine 0.78, sodium 137, potassium 4, chloride 106, CO2 of 25, calcium 8.7. LFTs were normal except albumin of 3.6. CPK was 79. CK-MB less than 0.5. WBC 17, hemoglobin 12.2, platelet count 282,000. WBC count dropped to 11.6 on 01/29/2014. Blood cultures x2: No growth. Ultrasound of the lower extremity was negative for deep vein thrombosis. Immunoglobulin levels IgG, IgA, IgM and IgE all were normal.   HOSPITAL COURSE: Please refer to H and P done by the admitting physician. The patient is a 19 year old white female with history of cellulitis in May 2015 who presents to the Emergency Room after she woke up and started having pain in her leg. There was swelling and erythema involving the right lower extremity. She was seen in the ED and we were asked to admit the patient. She was noted to have a leukocytosis of 17,000 as well as high-grade fever. Due to this, she was admitted and started on IV antibiotics. The patient was treated initially with IV cefazolin; however, she continued to have fever and swelling; therefore, her antibiotics were changed to Unasyn and vancomycin. She was kept on these for 2 days. She was seen in consultation by Dr. Sampson GoonFitzgerald. After improvement in her lower extremity swelling, she was switched  over cefazolin. The patient is doing better now. She still has some pain with walking, but the erythema is mostly resolved. Dr. Sampson GoonFitzgerald will see her as an outpatient. He recommends the patient may need chronic suppressive antibiotic therapy. Also vascular evaluation will be needed as an outpatient for further evaluation for her symptoms and possible venous insufficiency or lymphatic dysfunction in her lower extremity.   DISCHARGE MEDICATIONS: Acetaminophen/hydrocodone 325/5 mg 1 tab p.o. q.6 p.r.n., Keflex 500 mg 1 tab 4 times a day for 6 days.   DIET: Regular.   ACTIVITY: As tolerated.   FOLLOWUP: Follow up with Dr. Sampson GoonFitzgerald in 1 to 2 weeks. Follow up with Dr. Wyn Quakerew in 1 to 2 weeks.    TIME SPENT: 35 minutes on this patient.     ____________________________ Lacie ScottsShreyang H. Allena KatzPatel, MD shp:lm D: 01/31/2014 18:11:36 ET T: 02/01/2014 05:17:40 ET JOB#: [id number]  cc: Cresencia Asmus H. Allena KatzPatel, MD, <Dictator> Charise CarwinSHREYANG H Shweta Aman MD ELECTRONICALLY SIGNED 02/09/2014 9:43

## 2014-11-15 NOTE — H&P (Signed)
PATIENT NAME:  Gina Campbell, Gina Campbell MR#:  [id number] DATE OF BIRTH:  09-28-1995  DATE OF ADMISSION:  12/14/2013  PRIMARY CARE PHYSICIAN:  Dr. Cliffton Asters.  REFERRING EMERGENCY ROOM PHYSICIAN:  Dr. Manson Passey.   CHIEF COMPLAINT:  Right leg pain.   HISTORY OF PRESENT ILLNESS:  The patient is a 19 year old Caucasian female with past medical history of rhabdomyolysis in the past and GERD, is presenting to the ER with a chief complaint of two day history of right leg pain and swelling.  The patient is reporting that the pain and swelling started yesterday morning and slowly it has been getting worse.  Today the leg has been red and the redness, pain and swelling are climbing up to the knee.  Denies any traumas or injuries.  No bug bites.  No similar complaints in the past.  In the ER, the patient's urine pregnancy test is negative.  Venous Dopplers were done and DVT was ruled out.  The patient was found to be tachycardic.  Heart rate is fluctuating between 120s to 130s, but the patient denies any chest pain.  She was complaining of fever up to 101 degrees Fahrenheit since yesterday.  Denies any nausea, vomiting, abdominal pain.  No other complaints.  Boyfriend is next to bedside.  Denies any history of MRSA.   PAST SURGICAL HISTORY:  GERD and history of rhabdomyolysis.   PAST SURGICAL HISTORY:  None.   ALLERGIES:  No known drug allergies.   PSYCHOSOCIAL HISTORY:  Lives at home with boyfriend.  No history of smoking, alcohol or illicit drug usage.   FAMILY HISTORY:  Mom has history of hypertension. N  MEDICATIONS:  Not on any home medications.   REVIEW OF SYSTEMS:  CONSTITUTIONAL:  Complaining of fever, fatigue and weakness.  Denies any weight loss or weight gain.  Complaining of right lower extremity pain.  EYES:  Denies blurry vision, double vision.  Denies glaucoma.  EARS, NOSE, THROAT:  Denies epistaxis, discharge, snoring, tinnitus.  RESPIRATION:  Denies cough, COPD, or hemoptysis.  CARDIOVASCULAR:  No  chest pain, palpitations, syncope.  GASTROINTESTINAL:  Denies nausea, vomiting, diarrhea, abdominal pain.  GENITOURINARY:  No dysuria or hematuria.  GYNECOLOGIC AND BREAST:  Denies breast mass or vaginal discharge.  ENDOCRINE:  Denies polyuria, nocturia, thyroid problems.  HEMATOLOGY AND LYMPHATIC:  No anemia, easy bruising, bleeding.  INTEGUMENTARY:  No acne, rash, lesions, other than right leg swelling, pain and redness.  MUSCULOSKELETAL:  No joint pain in the neck and back.  Denies shoulders.  Denies gout.  NEUROLOGIC:  Denies vertigo, ataxia, dementia, headache. PSYCHIATRIC:  No ADD, OCD, insomnia.   PHYSICAL EXAMINATION: VITAL SIGNS:  Temperature 99.8, pulse initially 138, eventually it was at 120, respirations 20, blood pressure 109/55, pulse ox 100% on room air.  GENERAL APPEARANCE:  Not under acute distress.  Moderately built and nourished.  HEENT:  Normocephalic, atraumatic.  Pupils are equally reacting to light and accommodation.  No scleral icterus.  No conjunctival injection.  No sinus tenderness.  No postnasal drip.  NECK:  Supple.  No JVD.  No thyromegaly.  Range of motion is intact.  LUNGS:  Clear to auscultation.  No wheezing.  No crackles.  No rhonchi.  CARDIAC:  S1, S2 normal.  Regular rate and rhythm, tachycardic.  No anterior chest wall tenderness on palpation.  GASTROINTESTINAL:  Soft.  Bowel sounds are positive in all four quadrants.  Nontender, nondistended.  No hepatosplenomegaly.  No masses felt. NEUROLOGIC:  Awake, alert, oriented x 3.  Cranial nerves II through XII are grossly intact.  Motor and sensory are intact.  Reflexes are 2+.  EXTREMITIES:  Right lower extremity, right great toe paronychia is present.  Right lower extremity up to the knee area is erythematous, tender to touch and erythematous.  No open wounds are noticed.  No cyanosis.  No clubbing.  SKIN:  Warm to touch.  Normal turgor.  No rashes.  No lesions, other than right lower extremity redness and  cellulitis.  PSYCHIATRIC:  Normal mood and affect.   LABORATORY AND IMAGING STUDIES:  LFTs are normal except albumin at 3.4.  CK total is 147, CPK-MB 1.0.  WBC 16.1.  Hemoglobin and hematocrit are normal.  Platelet count is 255.  Urinalysis, leukocyte esterase 2+, nitrates negative.  Urine pregnancy test is negative.  Urine glucose is negative.  Chem-8 is normal.  Calcium 8.6.  Right lower extremity venous Doppler negative for DVT.  EKG has revealed sinus tachycardia at 120.   ASSESSMENT AND PLAN:  A 19 year old Caucasian female presenting to the Emergency Room with a chief complaint of two day history of fever and right lower extremity redness and swelling associated with pain will be admitted with the following assessment and plan.  1.  Sepsis from right lower extremity cellulitis.  The patient meets criteria for sepsis as she is febrile, tachycardic and has leukocytosis.  Blood cultures x 2 are ordered and the patient has received intravenous clindamycin.  We will continue Rocephin, rule out deep vein thrombosis.  2.  Probably acute cystitis.  We will obtain urine culture and sensitivity.  The patient is on intravenous Rocephin.  3.  History of gastroesophageal reflux disease.  The patient will be on Protonix.   4.  We will provide deep vein thrombosis prophylaxis.  5.  CODE STATUS:  SHE IS A FULL CODE.   Diagnosis and plan of care was discussed in detail with the patient.  She is aware of the plan.   Total time spent on the admission is 45 minutes.     ____________________________ Ramonita LabAruna Armonii Sieh, MD ag:ea D: 12/14/2013 01:46:49 ET T: 12/14/2013 02:33:59 ET JOB#: [id number]  cc: Ramonita LabAruna Delray Reza, MD, <Dictator> Ramonita LabARUNA Nolon Yellin MD ELECTRONICALLY SIGNED 12/26/2013 7:59

## 2014-11-15 NOTE — Consult Note (Signed)
PATIENT NAME:  Gina Campbell, Gina Campbell MR#:  [id number] DATE OF BIRTH:  [birth year]  DATE OF CONSULTATION:  01/28/2014  REFERRING PHYSICIAN:  Dr. Enedina FinnerSona Patel CONSULTING PHYSICIAN:  Stann Mainlandavid P. Sampson GoonFitzgerald, MD  REASON FOR CONSULTATION: Recurrent cellulitis.   HISTORY OF PRESENT ILLNESS: This is a pleasant 19 year old female who has had a history of recurrent cellulitis admitted July 6 with rapidly progressive swelling, redness and pain in her left foot. She has had several previous episodes. I had seen her initially in May of this year, at which point she was treated with IV and then oral antibiotics. I had recommended that she follow up with me, that we treat her with suppressive antibiotics, but she did not appear to follow up. At that time, she had responded to Keflex.   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 She has been having a lot of trouble keeping the swelling down. She has been wearing compression stockings.   PAST MEDICAL HISTORY: 1.  Recurrent cellulitis as above.  2.  Rhabdomyolysis of unknown etiology several years ago, potentially exercise-induced. [facility]  [facility] with liver and renal failure.   PAST SURGICAL HISTORY: None.   ALLERGIES: No known drug allergies.   SOCIAL HISTORY: Lives at home with her boyfriend and child. She does not smoke, drink or use drugs. She works as a Child psychotherapistwaitress.   FAMILY HISTORY: Positive for hypertension.   MEDICATIONS: She is on no outpatient medications. Antibiotics since admission include ampicillin, sulbactam and vancomycin.   REVIEW OF SYSTEMS: Eleven systems reviewed and negative except as per history of present illness.   PHYSICAL EXAMINATION: VITAL SIGNS: Temperature 103, currently 98.3, pulse 119, blood pressure 99/66, respirations 18, sat 100% on room air.  GENERAL: She is pleasant, interactive, in no acute distress. She is overweight.  HEENT: Pupils equal, round, and reactive to light and accommodation.  Extraocular movements are intact. Sclera are anicteric. Oropharynx is clear.  NECK: Supple.  HEART: Regular.  LUNGS: Clear to auscultation bilaterally.  ABDOMEN: Soft, nontender, nondistended. No hepatosplenomegaly.  EXTREMITIES: Her left lower extremity has 1+ edema. She has an area of erythema anteriorly over the foot. This is tender to palpation. She has no open wounds.   DIAGNOSTIC DATA: White blood count on admission is 17.0, hemoglobin 12.3, platelets 282. Diff shows neutrophil count of 11.5, lymphocytes 1.4. Blood cultures x2 are negative. Urinalysis is negative. CK is 79. Albumin 3.6. Ultrasound of her left lower extremity shows no evidence of deep vein thrombosis.   IMPRESSION: A 19 year old with history of prior severe cellulitis in 2013, admitted now with recurrent cellulitis. At this time, it is on the opposite leg than she had it most recently. At that last admission, I recommended that she go on suppressive oral penicillin, but she did not come to follow up. I explained to her that it is not unusual to have recurrent cellulitis once she has had damage to the lymphatics. She does report ongoing edema, difficult to control, and that even with compression stockings.   RECOMMENDATIONS: 1.  Continue vancomycin and Unasyn, but can likely transition to just Ancef if she remains afebrile. This is the most likely strep species. The last time, she responded well to Keflex.  2.  Elevate the legs on as much as possible.  3.   I can  follow with cellulitis as otpt 4.  If an HIV test has not been done, will order one tomorrow.  5.  I discussed how important it will be to have her follow up with me, as well as potentially with  vascular for further treatment of the chronic edema and recurrent infections. She would certainly benefit from suppressive therapy with penicillin twice a day for the long term.   Thank you for the consult. We will be glad to follow with you.    ____________________________ Stann Mainland. Sampson Goon, MD dpf:cg D: 01/28/2014 22:06:58 ET T: 01/28/2014 23:19:53 ET JOB#: [id number]  cc: Stann Mainland. Sampson Goon, MD, <Dictator> DAVID Sampson Goon MD ELECTRONICALLY SIGNED 02/10/2014 21:18

## 2014-11-15 NOTE — H&P (Signed)
PATIENT NAME:  Gina Campbell, Gina Campbell MR#:  [id number] DATE OF BIRTH:  26-Mar-1996  DATE OF ADMISSION:  01/27/2014.  PRIMARY CARE PROVIDER:  Doristine MangoElizabeth White[facility].  CHIEF COMPLAINT: Right lower extremity pain and redness for one day. Gina Campbell is a 19 year old Caucasian female with past medical history of rhabdomyolysis and GERD and  history of right lower extremity cellulitis in May 2015, presents to the Emergency Room after she woke up today and started having pain in her leg, could not walk much and saw some swelling. She came to the Emergency Room, started noticing some redness and tenderness in her left lower extremity. She was found to have left lower extremity cellulitis. The patient says she cannot bear any weight on that leg and is unable to walk. She received IV clindamycin in the Emergency Room, patient is being admitted for further evaluation and management. Her white cell count is 17,000. She has a low-grade fever and is mildly tachycardic. She denies any history of MRSA.  She denies any history of trauma or skin breakdown in the legs.    PAST MEDICAL HISTORY:  1.  GERD.  2.  History of rhabdomyolysis.  3.  History of right lower extremity cellulitis in May 2015.   ALLERGIES: No known drug allergies.   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 No alcohol or any illicit drug use.   FAMILY HISTORY:  Mother has hypertension.   MEDICATIONS: Not on any home medications.   REVIEW OF SYSTEMS:  CONSTITUTIONAL: No fever. Positive for left lower extremity weakness. No weight gain or weight loss.  EYES: No blurred or double vision, glaucoma, cataracts.  ENT: No tinnitus, ear pain, postnasal drip.  RESPIRATORY: No cough, wheeze, hemoptysis or dyspnea.  CARDIOVASCULAR: No chest pain, orthopnea, edema, or dyspnea on exertion.  GASTROINTESTINAL: No nausea, vomiting, diarrhea, abdominal pain.  GENITOURINARY: No dysuria, hematuria, or frequency.   ENDOCRINE: No polyuria, nocturia or thyroid problems.  HEMATOLOGY: No anemia, easy bruising or bleeding.  SKIN: No acne, rash.  MUSCULOSKELETAL: Positive for left leg cramp and pain.  NEUROLOGICAL: No CVA, TIA, or dementia.  PSYCHIATRIC: No anxiety, depression or bipolar disorder. All other systems reviewed are negative.   PHYSICAL EXAMINATION:  GENERAL: The patient is awake, alert, oriented x3, not in acute distress VITAL SIGNS: Temperature 99.2, pulse 117, blood pressure is 119/59, oxygen saturation 100% on room air.  HEENT: Atraumatic, normocephalic. PERRLA, EOMI.  Oral mucosa is moist.  NECK: Supple. No JVD. No carotid bruits.  RESPIRATORY: Clear to auscultation bilaterally. No rales, rhonchi, respiratory distress or labored breathing.  CARDIOVASCULAR: Tachycardia present. No murmur heard. PMI not lateralized.  CHEST:  Nontender.  EXTREMITIES:  Good pedal pulses. Good femoral pulses.  ABDOMEN:  Soft, benign, nontender. No organomegaly. Positive bowel sounds.  EXTREMITIES: Good pedal pulses. Good femoral pulses. No lower extremity edema. Left lower extremity tibia/shin has an area of cellulitis, marking has been done.  LYMPHATIC: No lymph nodes felt in the inguinal area. No breakdown.  SKIN: No skin breakdown.  NEUROLOGICAL: Grossly intact.  Cranial nerves II-XII intact. No motor or sensory deficit.  PSYCHIATRIC: The patient is awake, alert and oriented x3.  SKIN: Warm and dry.   LABORATORY DATA:  CBC within normal limits except white count of 17,000. Comprehensive metabolic panel within normal limits.   Urinalysis negative for UTI.  Doppler left lower extremity shows no evidence DVT.  ASSESSMENT:  Gina Campbell Date is a 19 year old female with  history of rhabdomyolysis and remote history of kidney failure in the past, comes in with:  1.  Left lower extremity cellulitis. The patient is going to be admitted to the medical floor.  Will start her on IV cefazolin.  She was treated with  Keflex in May 2015 and improved. At that time she had an infectious disease consultation. Will wait to see how she responds to antibiotics. If not then consider getting ID consultation. Ultrasound lower extremity on the left is negative for DVT. IV p.r.n. morphine and p.o. Percocet for pain as needed.  2.  Leukocytosis due to cellulitis.  3.  DVT prophylaxis with subcutaneous heparin.  4.  Further work up regarding patient's clinical course.   Hospital admission plan was discussed with the patient.   TIME SPENT: 50 minutes.    ____________________________ Wylie Hail Allena Katz, MD sap:lt D: 01/27/2014 12:36:00 ET T: 01/27/2014 13:01:11 ET JOB#: [id number]  cc: Gina Ruacho A. Allena Katz, MD, <Dictator> Willow Ora MD ELECTRONICALLY SIGNED 02/18/2014 15:34

## 2014-11-15 NOTE — H&P (Signed)
PATIENT NAME:  Gina Campbell, Gina Campbell MR#:  [id number] Campbell OF BIRTH:  26-Mar-1996  Campbell OF ADMISSION:  01/27/2014.  PRIMARY CARE PROVIDER:  Doristine MangoElizabeth White, FNP, at [facility].  CHIEF COMPLAINT: Right lower extremity pain and redness for one day. Gina Campbell is a 19 year old Caucasian female with past medical history of rhabdomyolysis and GERD and  history of right lower extremity cellulitis in May 2015, presents to the Emergency Room after she woke up today and started having pain in her leg, could not walk much and saw some swelling. She came to the Emergency Room, started noticing some redness and tenderness in her left lower extremity. She was found to have left lower extremity cellulitis. The patient says she cannot bear any weight on that leg and is unable to walk. She received IV clindamycin in the Emergency Room, patient is being admitted for further evaluation and management. Her white cell count is 17,000. She has a low-grade fever and is mildly tachycardic. She denies any history of MRSA.  She denies any history of trauma or skin breakdown in the legs.    PAST MEDICAL HISTORY:  1.  GERD.  2.  History of rhabdomyolysis.  3.  History of right lower extremity cellulitis in May 2015.   ALLERGIES: No known drug allergies.   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 No alcohol or any illicit drug use.   FAMILY HISTORY:  Mother has hypertension.   MEDICATIONS: Not on any home medications.   REVIEW OF SYSTEMS:  CONSTITUTIONAL: No fever. Positive for left lower extremity weakness. No weight gain or weight loss.  EYES: No blurred or double vision, glaucoma, cataracts.  ENT: No tinnitus, ear pain, postnasal drip.  RESPIRATORY: No cough, wheeze, hemoptysis or dyspnea.  CARDIOVASCULAR: No chest pain, orthopnea, edema, or dyspnea on exertion.  GASTROINTESTINAL: No nausea, vomiting, diarrhea, abdominal pain.  GENITOURINARY: No dysuria, hematuria, or frequency.   ENDOCRINE: No polyuria, nocturia or thyroid problems.  HEMATOLOGY: No anemia, easy bruising or bleeding.  SKIN: No acne, rash.  MUSCULOSKELETAL: Positive for left leg cramp and pain.  NEUROLOGICAL: No CVA, TIA, or dementia.  PSYCHIATRIC: No anxiety, depression or bipolar disorder. All other systems reviewed are negative.   PHYSICAL EXAMINATION:  GENERAL: The patient is awake, alert, oriented x3, not in acute distress VITAL SIGNS: Temperature 99.2, pulse 117, blood pressure is 119/59, oxygen saturation 100% on room air.  HEENT: Atraumatic, normocephalic. PERRLA, EOMI.  Oral mucosa is moist.  NECK: Supple. No JVD. No carotid bruits.  RESPIRATORY: Clear to auscultation bilaterally. No rales, rhonchi, respiratory distress or labored breathing.  CARDIOVASCULAR: Tachycardia present. No murmur heard. PMI not lateralized.  CHEST:  Nontender.  EXTREMITIES:  Good pedal pulses. Good femoral pulses.  ABDOMEN:  Soft, benign, nontender. No organomegaly. Positive bowel sounds.  EXTREMITIES: Good pedal pulses. Good femoral pulses. No lower extremity edema. Left lower extremity tibia/shin has an area of cellulitis, marking has been done.  LYMPHATIC: No lymph nodes felt in the inguinal area. No breakdown.  SKIN: No skin breakdown.  NEUROLOGICAL: Grossly intact.  Cranial nerves II-XII intact. No motor or sensory deficit.  PSYCHIATRIC: The patient is awake, alert and oriented x3.  SKIN: Warm and dry.   LABORATORY DATA:  CBC within normal limits except white count of 17,000. Comprehensive metabolic panel within normal limits.   Urinalysis negative for UTI.  Doppler left lower extremity shows no evidence DVT.  ASSESSMENT:  Gina Campbell is a 19 year old female with  history of rhabdomyolysis and remote history of kidney failure in the past, comes in with:  1.  Left lower extremity cellulitis. The patient is going to be admitted to the medical floor.  Will start her on IV cefazolin.  She was treated with  Keflex in May 2015 and improved. At that time she had an infectious disease consultation. Will wait to see how she responds to antibiotics. If not then consider getting ID consultation. Ultrasound lower extremity on the left is negative for DVT. IV p.r.n. morphine and p.o. Percocet for pain as needed.  2.  Leukocytosis due to cellulitis.  3.  DVT prophylaxis with subcutaneous heparin.  4.  Further work up regarding patient's clinical course.   Hospital admission plan was discussed with the patient.   TIME SPENT: 50 minutes.    ____________________________ (Dictation Anomaly) xxx:lt D: 01/27/2014 12:36:49 ET T: 01/27/2014 13:01:11 ET JOB#: [id number]  cc:

## 2014-11-15 NOTE — Consult Note (Signed)
PATIENT NAME:  Gina Campbell, Gina Campbell MR#:  [id number] DATE OF BIRTH:  03/17/1996  DATE OF CONSULTATION:  12/15/2013  REFERRING PHYSICIAN:  Katha Hamming, MD CONSULTING PHYSICIAN:  Stann Mainland. Sampson Goon, MD  REASON FOR CONSULTATION: Cellulitis.   HISTORY OF PRESENT ILLNESS:  This is a pleasant 18 year old female with history of rhabdomyolysis several years ago of unknown etiology who was admitted May 23rd with 2 days of right back pain and swelling. She states that it began as a stabbing sharp pain and progressed to redness and swelling and severe pain so she could not walk. She has had no injuries, traumas, insect bites or lesions on her skin. In the Emergency Room, she was seen and had Dopplers that were negative. Negative pregnancy test. She was admitted and started on IV antibiotics. We are consulted for further antibiotics.  PAST MEDICAL HISTORY:  Rhabdomyolysis 2013, unknown etiology, potentially from exercise-induced. She was transferred to [facility] at that time and had what she says was renal failure and liver failure.   PAST SURGICAL HISTORY: None.   ALLERGIES: No known drug allergies.   SOCIAL HISTORY: She lives at home with her boyfriend and her child. She does not smoke, drink or use drugs.   FAMILY HISTORY: Positive for hypertension in her mother.   MEDICATIONS: She has no outpatient medications.    ANTIBIOTICS SINCE ADMISSION: Include ceftriaxone. She also received a dose of clindamycin.   REVIEW OF SYSTEMS: Eleven systems reviewed and negative except as per history of present illness.   PHYSICAL EXAMINATION: VITAL SIGNS: Temperature over the last 24 hours 102.9, currently she is 99.3; pulse 108, blood pressure 107/69, respirations 18, sat 100%.  GENERAL:  She is lying in bed in no acute distress.  HEENT: Pupils equal, round and reactive to light and accommodation. Extraocular movements are intact. Sclerae anicteric. Oropharynx clear. No thrush.  NECK: Supple.  HEART:  Regular.  LUNGS: Clear.  ABDOMEN: Soft, nontender, nondistended. No hepatosplenomegaly.  EXTREMITIES: Her right lower extremity has marked erythema extending up to just below the knee. This is somewhat patchy. It is tender to palpation. She does have some swelling of the leg. There are no bullae or necrotic lesions. There is no evidence of pus.  NEUROLOGIC: She is alert and oriented x 3. Grossly nonfocal neuro exam.   LABORATORY, DIAGNOSTIC AND RADIOLOGICAL DATA: White blood count on admission was 16.1, currently it is 15; hemoglobin 12.1, platelets 186. Blood cultures x 2 negative. Urine culture negative. Urinalysis showed 14 white cells. Urine pregnancy test is negative. Renal function is normal. LFTs are normal. Imaging: Doppler of her lower extremity was negative.   IMPRESSION: A 19 year old with history of rhabdomyolysis of unclear etiology several years ago requiring admission and a 16-day hospital course at [facility]. She is admitted now with cellulitis in the right lower extremity. This is unprovoked without any obvious etiology. She has been tachycardic, febrile and has an elevated white count. She has been treated so far with ceftriaxone.   RECOMMENDATIONS: 1.  Continue ceftriaxone.  2.  Add vancomycin.  3.  Would add clindamycin temporarily to check on any toxin production given the tachycardia and fevers.  4.  Elevate the leg on at least 6 pillows.  5.  Hopefully, the leg will respond with IV antibiotics and she will be able to transition to orals.   Thank you for the consult. I will be glad to follow with you.   ____________________________ Stann Mainland. Sampson Goon, MD dpf:cs D: 12/15/2013 19:09:56 ET T: 12/15/2013  20:39:22 ET JOB#: [id number]  cc: Stann Mainlandavid P. Sampson GoonFitzgerald, MD, <Dictator> Marlies Ligman Sampson GoonFITZGERALD MD ELECTRONICALLY SIGNED 12/16/2013 21:01

## 2014-12-02 NOTE — H&P (Signed)
L&D Evaluation:  History Expanded:  HPI 19 yo G2P0010 at 40w0dby 10wk [place] presenting with contractions and h/o elevated BP, recent eval at [facility].  24 urine protein pending today.  Denies h/a, blurry vision, epig pain, CP, SOB.  Does have pitting edema LE.  PNC at [facility] noteworthy for 3 prior E-coli UTI's (pansensitive) was actually placed on macrobid supression already.  O positive / ABSC neg /MMR x 2 / VZNI / RPR NR / HIV neg / HBsAg neg / 1-hr OGTT 77   Patient's Medical History History of single episode of rhabdomyolisis unknown precipitating even (no trauma, drug exposure)   Patient's Surgical History none   Medications Pre Natal Vitamins  macrobid supression   Allergies NKDA   Social History none   Family History Non-Contributory   ROS:  ROS All systems were reviewed.  HEENT, CNS, GI, GU, Respiratory, CV, Renal and Musculoskeletal systems were found to be normal.   Exam:  Vital Signs P100.  BP 110/ 60 and 120/80, after initial 150/80s.   Urine Protein 1+   General no apparent distress   Mental Status clear   Chest clear   Heart normal sinus rhythm   Abdomen gravid, non-tender   Estimated Fetal Weight Average for gestational age   Edema no edema   Pelvic no external lesions, 1 cm, 50, hi   Mebranes Intact   Ucx absent   Skin dry   Other See Labs Urine 24 hour prot from [facility] = 267.   Impression:  Impression evaluation for PIH, Gest HTN   Plan:  Plan EFM/NST, monitor contractions and for cervical change, monitor BP, PIH panel   Comments No signs of preclampsia, BPs normalize and labs OK.  Prot/Cr ratio elevated yet 24 hour collection yesterday normal. Out patient management, rest, follow-up thursday.   Follow Up Appointment already scheduled   Electronic Signatures: HHoyt Koch(MD)  (Signed 30-Sep-14 15:40)  Authored: L&D Evaluation   Last Updated: 30-Sep-14 15:40 by HHoyt Koch(MD)
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
## 2014-12-02 NOTE — H&P (Signed)
L&D Evaluation:  History:  HPI 19 yo G1 at 7029wk1d gestational age by 10 week ultrasound giving her an EDC=04/30/2013 presents with c/o "passing out" twice today. She was at the store and just checked out when she felt lightheaded and passed out. She has bruising on her lower legs from the fall. The second syncopal episode was this evening at supper time when she returned from picking up pizza-she became lightheaded and started to fall-and her friend eased her down onto the floor. She denies CP or SOB associated with these episodes. No prior hx of syncopal episodes. Denies vaginal bleeding, abdominal pain, or LOF since falling. Baby active.She has also noticed increased edema in her lower extremities recently. Denies diarrhea, N/V, fever. Pregnancy complicated by recurrent UTI's for which she has been on suppression therapy (Macrobid qhs).  Patient states she is [facility]. O+ / RPR NR / Rubella (documented vaccination dates of 06/20/95 and 01/21/99) / VZNI / GBS unk   Presents with syncopal episodes x2 today   Patient's Medical History Hx of rhabdomolysis, recurrent UTIs   Patient's Surgical History none   Medications Gummie PNV, Macrobid qhs   Allergies NKDA   Social History none   Family History Non-Contributory   Exam:  Vital Signs stable  111/62   Urine Protein UA with positive nitrite, 2+ leuks, 43WBCs/HPF, 13 RBC/HPF   General no apparent distress   Mental Status clear   Chest clear   Heart normal sinus rhythm, Grade III/VI systolic murmur heard best in mitral and tricuspid areas   Abdomen gravid, non-tender, BS active, soft, NT   Edema 2+  pedal edema and mild pitting edema lower extremities   Mebranes Intact   FHT normal rate with no decels, 125-130 with accels to 140s   FHT Description mod variability   Fetal Heart Rate 130   Ucx one contraction in 1 1/2 hours of monitoring   Skin bruising on shins   Other H&H=9.5 gm/dl and  91.4%28.9%, plt WNL. CMP: K+3.3, Cl=110, glucose=91, BUN/cr wnl. EKG: NSR with premature venticular complexes (will need to be verified by cardiologist)   Impression:  Impression Syncopal episode x2 possibly due to vasovagal episodes. Anemia. UTI. Grade III/VI heart murmur.   Plan:  Plan Start Keflex 500 mgm tid x7 days, then resume suppression therapy   Comments Urine culture for sensitivity sent [facility] for transfer in appt next week to evaluate significant murmur in light of her syncopal episodes. Advised to continue to drink water. Avoid hot areas. Discussed not standing or sitting for long periods without walking or pedaling feet. RX for Fusion Plus iron -one daily.   Follow Up Appointment need to schedule. early next week to change suppression therapy medication based on sensitivities.   Electronic Signatures: Trinna BalloonGutierrez, Adelina Collard L (CNM)  (Signed 24-Jul-14 02:01)  Authored: L&D Evaluation   Last Updated: 24-Jul-14 02:01 by Trinna BalloonGutierrez, Brycen Bean L (CNM)

## 2014-12-02 NOTE — H&P (Signed)
L&D Evaluation:  History:  HPI 19 yo G2P0010 at 21w0dby 10wk [place] presenting with malaise since 02/23/13.  Patient reports diarrhea as starting symptoms, no nausea, no emesis.  Then developed dysuria, back pain, and fevers to 102.40F at home.  Also reports increased vaginal discharge, clear.  No VB, no ctx, +FM.  PNC at [facility] noteworthy for 3 prior E-coli UTI's (pansensitive) was actually placed on macrobid supression already.  O positive / ABSC neg /MMR x 2 / VZNI / RPR NR / HIV neg / HBsAg neg / 1-hr OGTT 77   Presents with back pain   Patient's Medical History History of single episode of rhabdomyolisis unknown precipitating even (no trauma, drug exposure)   Patient's Surgical History none   Medications Pre Natal Vitamins  macrobid supression   Allergies NKDA   Social History none   Family History Non-Contributory   ROS:  ROS All systems were reviewed.  HEENT, CNS, GI, GU, Respiratory, CV, Renal and Musculoskeletal systems were found to be normal.   Exam:  Vital Signs Tachycardic to 120's   Urine Protein See UA   General no apparent distress   Mental Status clear   Chest clear   Heart tachycardia   Abdomen gravid, non-tender   Estimated Fetal Weight Average for gestational age   Back Left CVA tenderness   Edema no edema   Pelvic no external lesions, visually closed   Mebranes Intact, negative pooling, nitrazine, and ferning   Ucx absent   Impression:  Impression Pylonephritis at 378w0destation   Plan:  Comments - start IV ceftriaxone - CBC, GC & CT,  and urine culture - admitt for 24-hrs afebrile on IV antibiotics/resolution of left flank pain - D5 1/2 NS at 15039mr   Electronic Signatures: StaDorthula NettlesD)  (Signed 05-Aug-14 21:04)  Authored: L&D Evaluation   Last Updated: 05-Aug-14 21:04 by StaDorthula NettlesD)

## 2014-12-02 NOTE — H&P (Signed)
Campbell&D Evaluation:  History:  HPI 19 yo G2 P0010 at 4839wk6d gestational age by 10 week ultrasound giving her an EDC=04/30/2013 presents with painful contractions since 2130. A few minutes after arrival she had SROM for thick MSAF. Denies vaginal bleeding, RUQ abdominal pain, headache, or visual changes. Has had N/V with labor. Baby active. PNC at [facility] remarkable for syncopal episodes at 29 weeks,  recurrent UTI's and an episode of pyelo at 31 weeks for which she has been on suppression therapy (Macrobid qhs), and iron deficiency anemia. More recently has had gestational hypertension/ possible mild preeclampsia. A PC ratio on 2 OCT O+ / RPR NR / Rubella (documented vaccination dates of 06/20/95 and 01/21/99) / VZNI / GBS negative   Presents with contractions   Patient's Medical History Hx of rhabdomolysis, recurrent UTIs, pyelo   Patient's Surgical History none   Medications Iron  Gummie PNV, Macrobid qhs   Allergies NKDA   Social History none   Family History Non-Contributory   ROS:  ROS see HPI   Exam:  Vital Signs 146/110, 154/98, 138/100   Urine Protein 1+   General writhing in bed with contractions, vomiting   Mental Status clear   Chest clear   Heart normal sinus rhythm, no murmur/gallop/rubs   Abdomen gravid, tender with contractions   Estimated Fetal Weight Average for gestational age   Fetal Position cephalic   Edema 2+  Pitting   Reflexes 1+   Pelvic no external lesions, 1-2/75-80%/-1   Mebranes Ruptured   Description green/meconium   FHT 110s-120 with some variable decels to 80-90 x 40 sec, difficult to keep up with due to patient's activity   FHT Description decreased initially   Impression:  Impression IUP at 39 6/7 weeks in early labor with MSAF and mild preelampsia   Plan:  Plan IV STadol and Zofran. PIH labs with routine labs. IUPC inserted  as well as FSE. 5 Contractions in 10 min window. FHR 110s to 120 with accels to 140-150 with mod  variability. Plan epidural if platelets WNL.   Comments Apresoline or labetalol if BPs>160/100. Consider Magnesium sulfate.   Electronic Signatures: Trinna BalloonGutierrez, Gina Campbell (CNM)  (Signed 06-Oct-14 01:23)  Authored: Campbell&D Evaluation   Last Updated: 06-Oct-14 01:23 by Trinna BalloonGutierrez, Fadel Clason Campbell (CNM)

## 2014-12-05 ENCOUNTER — Emergency Department: Payer: Medicaid Other

## 2014-12-05 ENCOUNTER — Inpatient Hospital Stay: Payer: Medicaid Other

## 2014-12-05 ENCOUNTER — Inpatient Hospital Stay
Admission: EM | Admit: 2014-12-05 | Discharge: 2014-12-07 | DRG: 872 | Disposition: A | Discharge status: Home / Self Care | Payer: Medicaid Other | Attending: Internal Medicine | Admitting: Internal Medicine

## 2014-12-05 DIAGNOSIS — R509 Fever, unspecified: Secondary | ICD-10-CM

## 2014-12-05 DIAGNOSIS — L03119 Cellulitis of unspecified part of limb: Secondary | ICD-10-CM

## 2014-12-05 DIAGNOSIS — D72829 Elevated white blood cell count, unspecified: Secondary | ICD-10-CM

## 2014-12-05 DIAGNOSIS — L03116 Cellulitis of left lower limb: Secondary | ICD-10-CM | POA: Diagnosis present

## 2014-12-05 DIAGNOSIS — R6510 Systemic inflammatory response syndrome (SIRS) of non-infectious origin without acute organ dysfunction: Secondary | ICD-10-CM | POA: Diagnosis present

## 2014-12-05 DIAGNOSIS — Z833 Family history of diabetes mellitus: Secondary | ICD-10-CM

## 2014-12-05 DIAGNOSIS — I872 Venous insufficiency (chronic) (peripheral): Secondary | ICD-10-CM | POA: Diagnosis present

## 2014-12-05 DIAGNOSIS — R079 Chest pain, unspecified: Secondary | ICD-10-CM

## 2014-12-05 DIAGNOSIS — E669 Obesity, unspecified: Secondary | ICD-10-CM | POA: Diagnosis present

## 2014-12-05 DIAGNOSIS — A419 Sepsis, unspecified organism: Secondary | ICD-10-CM | POA: Diagnosis not present

## 2014-12-05 DIAGNOSIS — I82409 Acute embolism and thrombosis of unspecified deep veins of unspecified lower extremity: Secondary | ICD-10-CM

## 2014-12-05 DIAGNOSIS — K5900 Constipation, unspecified: Secondary | ICD-10-CM

## 2014-12-05 DIAGNOSIS — L02419 Cutaneous abscess of limb, unspecified: Secondary | ICD-10-CM

## 2014-12-05 DIAGNOSIS — L0291 Cutaneous abscess, unspecified: Secondary | ICD-10-CM

## 2014-12-05 DIAGNOSIS — R609 Edema, unspecified: Secondary | ICD-10-CM

## 2014-12-05 HISTORY — DX: Cellulitis of unspecified part of limb: L03.119

## 2014-12-05 HISTORY — DX: Obesity, unspecified: E66.9

## 2014-12-05 LAB — CBC WITH DIFFERENTIAL/PLATELET
Basophils Absolute: 0.1 10*3/uL (ref 0–0.1)
Basophils Relative: 1 %
Eosinophils Absolute: 0.1 10*3/uL (ref 0–0.7)
HCT: 39.2 % (ref 35.0–47.0)
Hemoglobin: 12.7 g/dL (ref 12.0–16.0)
Lymphs Abs: 1.1 10*3/uL (ref 1.0–3.6)
MCH: 27.5 pg (ref 26.0–34.0)
MCHC: 32.4 g/dL (ref 32.0–36.0)
MCV: 84.9 fL (ref 80.0–100.0)
MONO ABS: 0.9 10*3/uL (ref 0.2–0.9)
NEUTROS ABS: 17.5 10*3/uL — AB (ref 1.4–6.5)
Neutrophils Relative %: 89 %
Platelets: 223 10*3/uL (ref 150–440)
RBC: 4.62 MIL/uL (ref 3.80–5.20)
RDW: 13.5 % (ref 11.5–14.5)
WBC: 19.7 10*3/uL — ABNORMAL HIGH (ref 3.6–11.0)

## 2014-12-05 LAB — COMPREHENSIVE METABOLIC PANEL
ALK PHOS: 73 U/L (ref 38–126)
ALT: 13 U/L — ABNORMAL LOW (ref 14–54)
AST: 23 U/L (ref 15–41)
Albumin: 3.9 g/dL (ref 3.5–5.0)
Anion gap: 8 (ref 5–15)
BILIRUBIN TOTAL: 0.5 mg/dL (ref 0.3–1.2)
BUN: 11 mg/dL (ref 6–20)
CO2: 26 mmol/L (ref 22–32)
Calcium: 8.9 mg/dL (ref 8.9–10.3)
Chloride: 103 mmol/L (ref 101–111)
Creatinine, Ser: 0.86 mg/dL (ref 0.44–1.00)
GFR calc non Af Amer: >60 mL/min (ref >60)
Glucose, Bld: 101 mg/dL — ABNORMAL HIGH (ref 65–99)
POTASSIUM: 3.5 mmol/L (ref 3.5–5.1)
SODIUM: 137 mmol/L (ref 135–145)
TOTAL PROTEIN: 8.2 g/dL — AB (ref 6.5–8.1)

## 2014-12-05 LAB — URINALYSIS COMPLETE WITH MICROSCOPIC (ARMC ONLY)
Bacteria, UA: NONE SEEN
Bilirubin Urine: NEGATIVE
Glucose, UA: NEGATIVE mg/dL
Hgb urine dipstick: NEGATIVE
Ketones, ur: NEGATIVE mg/dL
LEUKOCYTES UA: NEGATIVE
Nitrite: NEGATIVE
PH: 8 (ref 5.0–8.0)
PROTEIN: NEGATIVE mg/dL
SPECIFIC GRAVITY, URINE: 1.018 (ref 1.005–1.030)

## 2014-12-05 LAB — TROPONIN I: Troponin I: <0.03 ng/mL (ref <0.031)

## 2014-12-05 LAB — LIPASE, BLOOD: Lipase: 22 U/L (ref 22–51)

## 2014-12-05 LAB — POCT PREGNANCY, URINE: PREG TEST UR: NEGATIVE

## 2014-12-05 MED ORDER — MORPHINE SULFATE 4 MG/ML IJ SOLN
INTRAMUSCULAR | Status: AC
Start: 2014-12-05 — End: 2014-12-05
  Administered 2014-12-05: 4 mg
  Filled 2014-12-05: qty 1

## 2014-12-05 MED ORDER — HYDROMORPHONE HCL 1 MG/ML IJ SOLN
1.0000 mg | Freq: Once | INTRAMUSCULAR | Status: AC
  Administered 2014-12-05: 1 mg via INTRAVENOUS

## 2014-12-05 MED ORDER — DIPHENHYDRAMINE HCL 25 MG PO CAPS
25.0000 mg | ORAL_CAPSULE | Freq: Four times a day (QID) | ORAL | Status: DC | PRN
  Administered 2014-12-05: 25 mg via ORAL
  Filled 2014-12-05: qty 1

## 2014-12-05 MED ORDER — HYDROMORPHONE HCL 1 MG/ML IJ SOLN
INTRAMUSCULAR | Status: AC
  Administered 2014-12-05: 1 mg via INTRAVENOUS
  Filled 2014-12-05: qty 1

## 2014-12-05 MED ORDER — IOHEXOL 350 MG/ML SOLN
100.0000 mL | Freq: Once | INTRAVENOUS | Status: AC | PRN
  Administered 2014-12-05: 100 mL via INTRAVENOUS

## 2014-12-05 MED ORDER — PIPERACILLIN-TAZOBACTAM 3.375 G IVPB 30 MIN: 3.3750 g | Freq: Once | INTRAVENOUS | Status: DC

## 2014-12-05 MED ORDER — SODIUM CHLORIDE 0.9 % IV SOLN
INTRAVENOUS | Status: AC
  Administered 2014-12-05 (×2): via INTRAVENOUS

## 2014-12-05 MED ORDER — VANCOMYCIN HCL IN DEXTROSE 1-5 GM/200ML-% IV SOLN: 1000.0000 mg | Freq: Once | INTRAVENOUS | Status: DC

## 2014-12-05 MED ORDER — VANCOMYCIN HCL IN DEXTROSE 1-5 GM/200ML-% IV SOLN
INTRAVENOUS | Status: AC
  Administered 2014-12-05: 1000 mg via INTRAVENOUS
  Filled 2014-12-05: qty 200

## 2014-12-05 MED ORDER — ACETAMINOPHEN 500 MG PO TABS
1000.0000 mg | ORAL_TABLET | Freq: Once | ORAL | Status: AC
  Administered 2014-12-05: 1000 mg via ORAL

## 2014-12-05 MED ORDER — SODIUM CHLORIDE 0.9 % IV BOLUS (SEPSIS)
1000.0000 mL | INTRAVENOUS | Status: DC
  Administered 2014-12-05: 1000 mL via INTRAVENOUS

## 2014-12-05 MED ORDER — PIPERACILLIN-TAZOBACTAM 3.375 G IVPB
INTRAVENOUS | Status: AC
  Administered 2014-12-05: 3.375 g via INTRAVENOUS
  Filled 2014-12-05: qty 50

## 2014-12-05 MED ORDER — ONDANSETRON HCL 4 MG/2ML IJ SOLN
4.0000 mg | Freq: Four times a day (QID) | INTRAMUSCULAR | Status: DC | PRN
  Administered 2014-12-05 – 2014-12-07 (×2): 4 mg via INTRAVENOUS
  Filled 2014-12-05 (×2): qty 2

## 2014-12-05 MED ORDER — SODIUM CHLORIDE 0.9 % IV BOLUS (SEPSIS): 500.0000 mL | INTRAVENOUS | Status: DC

## 2014-12-05 MED ORDER — ONDANSETRON HCL 4 MG/2ML IJ SOLN
INTRAMUSCULAR | Status: AC
  Administered 2014-12-05: 4 mg
  Filled 2014-12-05: qty 2

## 2014-12-05 MED ORDER — PIPERACILLIN-TAZOBACTAM 3.375 G IVPB 30 MIN
3.3750 g | Freq: Once | INTRAVENOUS | Status: AC
  Administered 2014-12-05: 3.375 g via INTRAVENOUS

## 2014-12-05 MED ORDER — ACETAMINOPHEN 325 MG PO TABS: 650.0000 mg | ORAL_TABLET | Freq: Four times a day (QID) | ORAL | Status: DC | PRN

## 2014-12-05 MED ORDER — PIPERACILLIN-TAZOBACTAM 3.375 G IVPB 30 MIN
3.3750 g | Freq: Three times a day (TID) | INTRAVENOUS | Status: DC
  Administered 2014-12-05 – 2014-12-07 (×7): 3.375 g via INTRAVENOUS
  Filled 2014-12-05 (×11): qty 50

## 2014-12-05 MED ORDER — VANCOMYCIN HCL IN DEXTROSE 1-5 GM/200ML-% IV SOLN
1000.0000 mg | Freq: Once | INTRAVENOUS | Status: AC
  Administered 2014-12-05: 1000 mg via INTRAVENOUS

## 2014-12-05 MED ORDER — ACETAMINOPHEN 500 MG PO TABS
ORAL_TABLET | ORAL | Status: AC
  Administered 2014-12-05: 1000 mg via ORAL
  Filled 2014-12-05: qty 2

## 2014-12-05 MED ORDER — MORPHINE SULFATE 2 MG/ML IJ SOLN
2.0000 mg | INTRAMUSCULAR | Status: DC | PRN
  Administered 2014-12-05 – 2014-12-06 (×4): 2 mg via INTRAVENOUS
  Filled 2014-12-05 (×4): qty 1

## 2014-12-05 MED ORDER — HYDROCODONE-ACETAMINOPHEN 5-325 MG PO TABS
1.0000 | ORAL_TABLET | ORAL | Status: DC | PRN
  Administered 2014-12-05 – 2014-12-07 (×7): 2 via ORAL
  Filled 2014-12-05 (×7): qty 2

## 2014-12-05 MED ORDER — VANCOMYCIN HCL IN DEXTROSE 750-5 MG/150ML-% IV SOLN
750.0000 mg | Freq: Three times a day (TID) | INTRAVENOUS | Status: DC
  Administered 2014-12-05 – 2014-12-07 (×7): 750 mg via INTRAVENOUS
  Filled 2014-12-05 (×10): qty 150

## 2014-12-05 MED ORDER — ENOXAPARIN SODIUM 40 MG/0.4ML ~~LOC~~ SOLN
40.0000 mg | SUBCUTANEOUS | Status: DC
  Administered 2014-12-05 – 2014-12-07 (×3): 40 mg via SUBCUTANEOUS
  Filled 2014-12-05 (×3): qty 0.4

## 2014-12-05 NOTE — Progress Notes (Addendum)
ANTIBIOTIC CONSULT NOTE - INITIAL  Pharmacy Consult for Vancomycin/Zosyn  Indication: cellulitis  No Known Allergies  Patient Measurements: Height: 5\' 3"  (160 cm) Weight: 160 lb (72.576 kg) IBW/kg (Calculated) : 52.4 Adjusted Body Weight: 60.5 kg  Vital Signs: Temp: 98.3 F (36.8 C) (05/13 0845) Temp Source: Oral (05/13 0845) BP: 104/60 mmHg (05/13 0845) Pulse Rate: 100 (05/13 0845) Intake/Output from previous day:   Intake/Output from this shift:    Labs:  Recent Labs  12/05/14 0306  WBC 19.7*  HGB 12.7  PLT 223  CREATININE 0.86   Estimated Creatinine Clearance: 99.7 mL/min (by C-G formula based on Cr of 0.86). No results for input(s): VANCOTROUGH, VANCOPEAK, VANCORANDOM, GENTTROUGH, GENTPEAK, GENTRANDOM, TOBRATROUGH, TOBRAPEAK, TOBRARND, AMIKACINPEAK, AMIKACINTROU, AMIKACIN in the last 72 hours.   Microbiology: No results found for this or any previous visit (from the past 720 hour(s)).  Medical History: History reviewed. No pertinent past medical history.  Medications:  Scheduled:  . enoxaparin (LOVENOX) injection  40 mg Subcutaneous Q24H  . piperacillin-tazobactam  3.375 g Intravenous 3 times per day  . vancomycin  750 mg Intravenous Q8H   Assessment: Patient is a 19 yo female with orders for Vancomycin and Zosyn for treatment of cellulitis.  Patient takes Penicillin oral for prophylaxis and is followed by ID as outpatient for chronic cellulitis.  Patient received Vancomycin 1 gm IV once in ED at 0500 on 5/13.  SCr: 0.86, est CrCl~99.6 mL/min, ke: 0.087, t1/2: 8 h, VD: 42.3 L  Goal of Therapy:  Vancomycin trough level 10-15 mcg/ml  Plan:  Will start Vancomycin 750 mg IV q8h.  Next dose to be given 6 hours after first dose for stacked dosing.  Will order trough prior to 5th dose on 5/14 at 1830.  Follow up with culture results.   Will continue Zosyn 3.375 gm IV q8h per EI protocol based on renal function.  Pharmacy will continue to follow.  Clarisa Schoolsrystal  Delena Casebeer, PharmD Clinical Pharmacist 12/05/2014

## 2014-12-05 NOTE — Progress Notes (Signed)
PT  C/O ITCHING THIS AFTERNOON. DR Nemiah CommanderKALISETTI NOTIFIED. MD ORDERS BENADRYL 25MG  PO Q6HR PRN.RECEIVED 1 DOSE WITH RELIEF. LLE PINKISH/RED. TENDER AND WARM. KEEPING LLE ELEVATED ON 2 PILLOWS. PAIN RELIEF WITH NORCO AND MORPHINE. ZOFRAN X1 FOR NAUSEA WITH RELIEF. TOLERATING IV ANTIBIOTICS

## 2014-12-05 NOTE — ED Notes (Signed)
Patient back from CT.

## 2014-12-05 NOTE — Progress Notes (Signed)
[facility] [place] [facility]   PATIENT NAME: Gina Campbell    MR#:  [id number]  DATE OF BIRTH:  05/10/1996  SUBJECTIVE:  CHIEF COMPLAINT:   Chief Complaint  Patient presents with  . Leg Pain   admitted for left lower extremity cellulitis. Third time in the last year. Denies any trauma or open ulcers. Symptoms started last night. Started on IV antibiotics at this time. Fevers are improved.  REVIEW OF SYSTEMS:  Review of Systems  Constitutional: Positive for fever and chills.  Respiratory: Negative for cough, shortness of breath and wheezing.   Cardiovascular: Negative for chest pain and palpitations.  Gastrointestinal: Positive for nausea and vomiting. Negative for abdominal pain, diarrhea and constipation.  Genitourinary: Negative for dysuria.  Neurological: Negative for dizziness, seizures and headaches.    DRUG ALLERGIES:  No Known Allergies  VITALS:  Blood pressure 104/60, pulse 103, temperature 98.3 F (36.8 C), temperature source Oral, resp. rate 18, height 5\' 3"  (1.6 m), weight 72.576 kg (160 lb), SpO2 100 %.  PHYSICAL EXAMINATION:  Physical Exam  GENERAL:  19 y.o.-year-old patient lying in the bed with no acute distress.  EYES: Pupils equal, round, reactive to light and accommodation. No scleral icterus. Extraocular muscles intact.  HEENT: Head atraumatic, normocephalic. Oropharynx and nasopharynx clear.  NECK:  Supple, no jugular venous distention. No thyroid enlargement, no tenderness.  LUNGS: Normal breath sounds bilaterally, no wheezing, rales,rhonchi or crepitation. No use of accessory muscles of respiration.  CARDIOVASCULAR: S1, S2 normal. No murmurs, rubs, or gallops.  ABDOMEN: Soft, nontender, nondistended. Bowel sounds present. No organomegaly or mass.  EXTREMITIES: Left lower extremity with erythema and tenderness and swelling extending up to almost the knee. No open ulcers noted. No skin tears. NEUROLOGIC: Cranial nerves II  through XII are intact. Muscle strength 5/5 in all extremities. Sensation intact. Gait not checked.  PSYCHIATRIC: The patient is alert and oriented x 3.  SKIN: No obvious rash, lesion, or ulcer.    LABORATORY PANEL:   CBC  Recent Labs Lab 12/05/14 0306  WBC 19.7*  HGB 12.7  HCT 39.2  PLT 223   ------------------------------------------------------------------------------------------------------------------  Chemistries   Recent Labs Lab 12/05/14 0306  NA 137  K 3.5  CL 103  CO2 26  GLUCOSE 101*  BUN 11  CREATININE 0.86  CALCIUM 8.9  AST 23  ALT 13*  ALKPHOS 73  BILITOT 0.5   ------------------------------------------------------------------------------------------------------------------  Cardiac Enzymes  Recent Labs Lab 12/05/14 0306  TROPONINI <0.03   ------------------------------------------------------------------------------------------------------------------  RADIOLOGY:  Ct Angio Chest Pe W/cm &/or Wo Cm  12/05/2014   CLINICAL DATA:  Bilateral leg pain. Numb Ing sensation to the lower extremities. Chest pain.  EXAM: CT ANGIOGRAPHY CHEST WITH CONTRAST  TECHNIQUE: Multidetector CT imaging of the chest was performed using the standard protocol during bolus administration of intravenous contrast. Multiplanar CT image reconstructions and MIPs were obtained to evaluate the vascular anatomy.  CONTRAST:  100mL OMNIPAQUE IOHEXOL 350 MG/ML SOLN  COMPARISON:  None.  FINDINGS: Technically limited study although there is moderately good opacification of the central pulmonary arteries. No large central pulmonary emboli are demonstrated although peripheral emboli cannot be excluded.  Normal heart size. Normal caliber thoracic aorta. No evidence of aortic dissection, allowing for motion artifact. Increased density in the mediastinum likely due to thymic tissue. No significant lymphadenopathy in the chest. Esophagus is decompressed.  Lungs are clear. No focal airspace disease  or consolidation. Airways are patent. No pleural effusions. No pneumothorax.  Included portions of the upper abdominal organs are grossly unremarkable.  Review of the MIP images confirms the above findings.  IMPRESSION: Examination is technically limited although no large central pulmonary emboli are visualized. No evidence of active pulmonary disease.   Electronically Signed   By: Burman NievesWilliam  Stevens M.D.   On: 12/05/2014 06:27    EKG:   Orders placed or performed during the hospital encounter of 12/05/14  . ED EKG  . ED EKG    ASSESSMENT AND PLAN:   19 year old young female with no significant past medical history presents to the hospital secondary to cellulitis of left lower extremity.  #1 Early sepsis -with tachycardia fever and leukocytosis. Secondary to left lower extremity cellulitis. Blood cultures are done and pending at this time. Continue IV vancomycin and Zosyn at this time. Once clinically improving we'll change to oral antibiotics and discharge. Possible discharge in 1-2 days depending upon clinical condition.  #2 chronic lower extremity swelling-likely venous insufficiency. Uses compression stockings and as needed fluid pill at home.  #3 DVT prophylaxis-on Lovenox.   All the records are reviewed and case discussed with Care Management/Social Workerr. Management plans discussed with the patient, family and they are in agreement.  CODE STATUS: Full code  TOTAL TIME TAKING CARE OF THIS PATIENT: 36 minutes.   POSSIBLE D/C IN 1-2 DAYS, DEPENDING ON CLINICAL CONDITION.   Enid BaasKALISETTI,Alessandro Griep M.D on 12/05/2014 at 12:11 PM  Between 7am to 6pm - Pager - [phone]  After 6pm go to [email or web address] - password [email or web address] [facility]  [facility]  Office  [phone]  CC: Primary care physician; WHITE, Arlyss RepressELIZABETH BURNEY, NP

## 2014-12-05 NOTE — ED Notes (Signed)
Patient transported to CT 

## 2014-12-05 NOTE — ED Notes (Signed)
Hospitalist at bedside, completing admission assessment.

## 2014-12-05 NOTE — Care Management (Signed)
Spoke with patient regarding discharge planning. RNCM to follow for home IV antibiotics need. She states she is independent with ambulation. PCP is with [facility]. She states she has not had any trouble affording medications.

## 2014-12-05 NOTE — H&P (Signed)
[facility] [place] [facility]   PATIENT NAME: Gina Campbell    MR#:  [id number]  DATE OF BIRTH:  09/05/1995  DATE OF ADMISSION:  12/05/2014  PRIMARY CARE PHYSICIAN: WHITE, Arlyss RepressELIZABETH BURNEY, NP   REQUESTING/REFERRING PHYSICIAN: Chiquita LothJade Sung  CHIEF COMPLAINT:   Chief Complaint  Patient presents with  . Leg Pain    HISTORY OF PRESENT ILLNESS:  Gina Campbell  is a 19 y.o. female with a known history of prior cellulitis of left lower extremity presents to the emergency room with the complaint of acute onset of pain and redness of left lower extremity which started last night. He also complains of fever with chills. He did have nausea with 1 episode of emesis. Denies abdominal pain. Admits having shortness of breath. No dysuria. She gives a history of prior left lower extremity cellulitis a year ago for which she was admitted and treated with IV antibiotics and since then she has been followed by infectious disease M.D. Dr. Sampson GoonFitzgerald, taking oral penicillin 1 tablet every day per ID recommendation. In the emergency room patient was evaluated by the ED physician, found to be febrile, lab work significant for leukocytosis. She was noted to have persistent tachycardia, she underwent CT of the chest which was reported negative for pulmonary embolism. After obtaining blood cultures patient was started on IV antibiotics namely vancomycin and Zosyn and hospitalist service was consulted for further management. Patient did receive IV pain medications and currently her pain is under control.  PAST MEDICAL HISTORY:  History reviewed. No pertinent past medical history. history of prior cellulitis left lower extremity, history of rhabdomyolysis in 2013, chronic pedal edema  PAST SURGICAL HISTORY:  History reviewed. No pertinent past surgical history.  SOCIAL HISTORY:   History  Substance Use Topics  . Smoking status: Never Smoker   . Smokeless tobacco: Not on file  .  Alcohol Use: No    FAMILY HISTORY:   Family History  Problem Relation Age of Onset  . Diabetes Father   . Diabetes Other     DRUG ALLERGIES:  No Known Allergies  REVIEW OF SYSTEMS:   Review of Systems  Constitutional: Positive for fever and chills. Negative for malaise/fatigue.  HENT: Negative for ear pain, hearing loss, nosebleeds, sore throat and tinnitus.   Eyes: Negative for blurred vision, double vision, pain, discharge and redness.  Respiratory: Positive for shortness of breath. Negative for cough, hemoptysis, sputum production and wheezing.   Cardiovascular: Negative for chest pain, palpitations, orthopnea and leg swelling.  Gastrointestinal: Positive for nausea and vomiting. Negative for abdominal pain, diarrhea, constipation, blood in stool and melena.  Genitourinary: Negative for dysuria, urgency, frequency and hematuria.  Musculoskeletal: Negative for back pain, joint pain and neck pain.  Skin: Positive for rash. Negative for itching.       Redness and pain left leg  Neurological: Negative for dizziness, tingling, sensory change, focal weakness and seizures.  Endo/Heme/Allergies: Does not bruise/bleed easily.  Psychiatric/Behavioral: Negative for depression. The patient is not nervous/anxious.     MEDICATIONS AT HOME:   Prior to Admission medications   Not on File      VITAL SIGNS:  Blood pressure 107/57, pulse 112, temperature 99 F (37.2 C), temperature source Oral, resp. rate 22, height 5\' 3"  (1.6 m), weight 72.576 kg (160 lb), SpO2 98 %.  PHYSICAL EXAMINATION:  Physical Exam  Constitutional: She is oriented to person, place, and time. She appears well-developed and well-nourished. No distress.  HENT:  Head: Normocephalic and atraumatic.  Right Ear: External ear normal.  Left Ear: External ear normal.  Nose: Nose normal.  Mouth/Throat: Oropharynx is clear and moist. No oropharyngeal exudate.  Eyes: EOM are normal. Pupils are equal, round, and reactive  to light. No scleral icterus.  Neck: Normal range of motion. Neck supple. No JVD present. No thyromegaly present.  Cardiovascular: Normal rate, regular rhythm, normal heart sounds and intact distal pulses.  Exam reveals no friction rub.   No murmur heard. Tachycardia +  Respiratory: Effort normal and breath sounds normal. No respiratory distress. She has no wheezes. She has no rales. She exhibits no tenderness.  GI: Soft. Bowel sounds are normal. She exhibits no distension and no mass. There is no tenderness. There is no rebound and no guarding.  Musculoskeletal: Normal range of motion. She exhibits no edema.  Lymphadenopathy:    She has no cervical adenopathy.  Neurological: She is alert and oriented to person, place, and time. She has normal reflexes. She displays normal reflexes. No cranial nerve deficit. She exhibits normal muscle tone.  Skin: Skin is warm. Rash noted. There is erythema.  Psychiatric: She has a normal mood and affect. Her behavior is normal. Thought content normal.  Redness left leg with mild swelling and local tenderness+   LABORATORY PANEL:   CBC  Recent Labs Lab 12/05/14 0306  WBC 19.7*  HGB 12.7  HCT 39.2  PLT 223   ------------------------------------------------------------------------------------------------------------------  Chemistries   Recent Labs Lab 12/05/14 0306  NA 137  K 3.5  CL 103  CO2 26  GLUCOSE 101*  BUN 11  CREATININE 0.86  CALCIUM 8.9  AST 23  ALT 13*  ALKPHOS 73  BILITOT 0.5   ------------------------------------------------------------------------------------------------------------------  Cardiac Enzymes  Recent Labs Lab 12/05/14 0306  TROPONINI <0.03   ------------------------------------------------------------------------------------------------------------------  RADIOLOGY:  Ct Angio Chest Pe W/cm &/or Wo Cm  12/05/2014   CLINICAL DATA:  Bilateral leg pain. Numb Ing sensation to the lower extremities.  Chest pain.  EXAM: CT ANGIOGRAPHY CHEST WITH CONTRAST  TECHNIQUE: Multidetector CT imaging of the chest was performed using the standard protocol during bolus administration of intravenous contrast. Multiplanar CT image reconstructions and MIPs were obtained to evaluate the vascular anatomy.  CONTRAST:  100mL OMNIPAQUE IOHEXOL 350 MG/ML SOLN  COMPARISON:  None.  FINDINGS: Technically limited study although there is moderately good opacification of the central pulmonary arteries. No large central pulmonary emboli are demonstrated although peripheral emboli cannot be excluded.  Normal heart size. Normal caliber thoracic aorta. No evidence of aortic dissection, allowing for motion artifact. Increased density in the mediastinum likely due to thymic tissue. No significant lymphadenopathy in the chest. Esophagus is decompressed.  Lungs are clear. No focal airspace disease or consolidation. Airways are patent. No pleural effusions. No pneumothorax.  Included portions of the upper abdominal organs are grossly unremarkable.  Review of the MIP images confirms the above findings.  IMPRESSION: Examination is technically limited although no large central pulmonary emboli are visualized. No evidence of active pulmonary disease.   Electronically Signed   By: Burman NievesWilliam  Stevens M.D.   On: 12/05/2014 06:27    EKG:   Orders placed or performed during the hospital encounter of 12/05/14  . ED EKG  . ED EKG    IMPRESSION AND PLAN:   1. Left lower extremity cellulitis. History of prior cellulitis left lower extremity year ago, patient on chronic prophylaxis with oral antibiotics per ID recommendation. Plan: Admit to MedSurg, continue IV antibiotics vancomycin, pain  control meds, follow-up CBC, ID consultation requested for further advice. 2. Tachycardia with shortness of breath, fever leukocytosis-SIRS. CTA chest negative for pulmonary embolism.    All the records are reviewed and case discussed with ED  provider. Management plans discussed with the patient, family and they are in agreement.  CODE STATUS: Full code  TOTAL TIME TAKING CARE OF THIS PATIENT: 50 minutes.    Crissie Figures M.D on 12/05/2014 at 7:20 AM  Between 7am to 6pm - Pager - [phone]  After 6pm go to [email or web address] - password [email or web address] [facility]  [place] [place] [facility]  Office  [phone]  CC: Primary care physician; WHITE, Arlyss Repress, NP

## 2014-12-05 NOTE — ED Notes (Signed)
Pt presents to the ER from home with complaints of bilaterally leg pain. Pt reports she went to bed around 22:00 last night, and woke up an hour later with numbing sensation to both lower extremities, burning sensation to both lower extremities, pt reports she had similar episode last year and had to be admitted to hospital due to cellulitis.

## 2014-12-05 NOTE — ED Provider Notes (Signed)
[facility] Emergency Department Provider Note  ____________________________________________  Time seen: Approximately 4:29 AM  I have reviewed the triage vital signs and the nursing notes.   HISTORY  Chief Complaint Leg Pain    HPI Gina Campbell is a 19 y.o. female who presents with left lower leg pain noted approximately 11 PM upon awakening. Patient describes 10/10 sharp, burning pain to left ankle and calf associated with warmth and redness to left anterior ankle. Patient denies trauma, injury, insect bite. Patient reports she had 2 similar episodes last year which required hospital admission for cellulitis.  Patient also states she was diagnosed with right lower extremity DVT last year; however, patient was not placed on blood thinners.  Patient denies recent travel history; denies OCP use.  Patient does complain of mild chest pain and shortness of breath, fever, chills, nausea.   History reviewed. No pertinent past medical history.   "Blood clot in right leg" Bilateral lower extremity swelling  Patient Active Problem List   Diagnosis Date Noted  . Cellulitis of left leg 12/05/2014  . SIRS (systemic inflammatory response syndrome) 12/05/2014    History reviewed. No pertinent past surgical history.  Current Outpatient Rx  Name  Route  Sig  Dispense  Refill  . furosemide (LASIX) 20 MG tablet   Oral   Take 20 mg by mouth daily.         . penicillin v potassium (VEETID) 500 MG tablet   Oral   Take 500 mg by mouth daily.           Allergies Review of patient's allergies indicates no known allergies.  Family History  Problem Relation Age of Onset  . Diabetes Father   . Diabetes Other    Diabetes Mellitus  Social History History  Substance Use Topics  . Smoking status: Never Smoker   . Smokeless tobacco: Not on file  . Alcohol Use: No   Positive tobacco use  Review of Systems Constitutional: Positive for  fever/chills Eyes: No visual changes. ENT: No sore throat. Cardiovascular: Positive for chest pain. Respiratory: Positive for shortness of breath. Gastrointestinal: No abdominal pain.  No nausea, no vomiting.  No diarrhea.  No constipation. Genitourinary: Negative for dysuria. Musculoskeletal: Negative for back pain. Positive for left lower extremity pain. Skin: Negative for rash. Positive for warmth and redness to left lower extremity. Neurological: Negative for headaches, focal weakness or numbness.  10-point ROS otherwise negative.  ____________________________________________   PHYSICAL EXAM:  VITAL SIGNS: ED Triage Vitals  Enc Vitals Group     BP 12/05/14 0222 158/143 mmHg     Pulse Rate 12/05/14 0222 131     Resp 12/05/14 0222 24     Temp 12/05/14 0222 100.6 F (38.1 C)     Temp Source 12/05/14 0222 Oral     SpO2 12/05/14 0222 100 %     Weight 12/05/14 0222 160 lb (72.576 kg)     Height 12/05/14 0222 5\' 3"  (1.6 m)     Head Cir --      Peak Flow --      Pain Score 12/05/14 0226 10     Pain Loc --      Pain Edu? --      Excl. in GC? --     Constitutional: Alert and oriented. Well appearing in mild acute distress. Eyes: Conjunctivae are normal. PERRL. EOMI. Head: Atraumatic. Nose: No congestion/rhinnorhea. Mouth/Throat: Mucous membranes are moist.  Oropharynx non-erythematous. Neck: No stridor.  Cardiovascular: Tachycardic, regular rhythm. Grossly normal heart sounds.  Good peripheral circulation. 2+ femoral, popliteal, DP and TP pulses bilaterally. Respiratory: Normal respiratory effort.  No retractions. Lungs CTAB. Gastrointestinal: Soft and nontender. No distention. No abdominal bruits. No CVA tenderness. Musculoskeletal: 1+ nonpitting bilateral lower extremity edema.  No joint effusions. Left anterior ankle with warmth and erythema. Left calf tender to palpation. Neurologic:  Normal speech and language. No gross focal neurologic deficits are appreciated. Speech  is normal. No gait instability. Skin:  Skin is warm, dry and intact. No rash noted. Warmth and erythema noted to anterior aspect of left ankle. No fainting or bite marks noted. Psychiatric: Mood and affect are normal. Speech and behavior are normal.  ____________________________________________   LABS (all labs ordered are listed, but only abnormal results are displayed)  Labs Reviewed  CBC WITH DIFFERENTIAL/PLATELET - Abnormal; Notable for the following:    WBC 19.7 (*)    Neutro Abs 17.5 (*)    All other components within normal limits  COMPREHENSIVE METABOLIC PANEL - Abnormal; Notable for the following:    Glucose, Bld 101 (*)    Total Protein 8.2 (*)    ALT 13 (*)    All other components within normal limits  CULTURE, BLOOD (ROUTINE X 2)  CULTURE, BLOOD (ROUTINE X 2)  TROPONIN I  LIPASE, BLOOD  URINALYSIS COMPLETEWITH MICROSCOPIC [facility])   POCT PREGNANCY, URINE   ____________________________________________  EKG  ED ECG REPORT   Date: 12/05/2014  EKG Time: 0448  Rate: 121  Rhythm: sinus tachycardia  Axis: Normal  Intervals:none  ST&T Change: Nonspecific  ____________________________________________  RADIOLOGY  CT chest interpreted per Dr. Andria MeuseStevens:   Examination is technically limited although no large central pulmonary emboli are visualized. No evidence of active pulmonary disease.  ____________________________________________   PROCEDURES  Procedure(s) performed: None  Critical Care performed: No  ____________________________________________   INITIAL IMPRESSION / ASSESSMENT AND PLAN / ED COURSE  Pertinent labs & imaging results that were available during my care of the patient were reviewed by me and considered in my medical decision making (see chart for details).  19 year old female who presents with left lower extremity pain, warmth and erythema. Patient is febrile and tachycardic. Patient has calf pain with prior history of "blood  clots". Given that patient complains of chest pain and shortness of breath will proceed with CT a of chest to evaluate for PE. IV fluids, analgesics given. Will start broad-spectrum IV antibiotics.  ----------------------------------------- 6:39 AM on 12/05/2014 -----------------------------------------  Patient heart rate 110 after IV fluids. Blood pressure now 95/42. Concern for cellulitis developing into SIRS and possibly sepsis. Will admit to hospitalist.   ____________________________________________   FINAL CLINICAL IMPRESSION(S) / ED DIAGNOSES  Final diagnoses:  Chest pain  Cellulitis and abscess of lower extremity  SIRS (systemic inflammatory response syndrome)  Fever, unspecified fever cause  Leukocytosis      Irean HongJade J Keynan Heffern, MD 12/05/14 [phone]

## 2014-12-05 NOTE — ED Notes (Signed)
In room to introduce self to patient, pt talking on phone when entering room. Pt appears calm, in NAD, RR even and unlabored, color WNL. When prompted about pain, pt states that her pain is "off the charts". Pt does not give number. Pain to lower left leg. Pt repositioned, offered cool rag, lights dimmed, TV on, call bell within reach, side rails up X1. Pt updated on plan of care. Denies further needs at this time. Awaiting bed to be assigned to patient. Pt polite, smiling, appropriate.

## 2014-12-06 LAB — CBC
HEMATOCRIT: 36.4 % (ref 35.0–47.0)
HEMOGLOBIN: 12 g/dL (ref 12.0–16.0)
MCH: 28.2 pg (ref 26.0–34.0)
MCHC: 33.1 g/dL (ref 32.0–36.0)
MCV: 85.1 fL (ref 80.0–100.0)
PLATELETS: 182 10*3/uL (ref 150–440)
RBC: 4.28 MIL/uL (ref 3.80–5.20)
RDW: 13.6 % (ref 11.5–14.5)
WBC: 12.8 10*3/uL — AB (ref 3.6–11.0)

## 2014-12-06 LAB — BASIC METABOLIC PANEL
ANION GAP: 5 (ref 5–15)
BUN: 9 mg/dL (ref 6–20)
CO2: 24 mmol/L (ref 22–32)
CREATININE: 0.98 mg/dL (ref 0.44–1.00)
Calcium: 8 mg/dL — ABNORMAL LOW (ref 8.9–10.3)
Chloride: 107 mmol/L (ref 101–111)
GFR calc Af Amer: >60 mL/min (ref >60)
GFR calc non Af Amer: >60 mL/min (ref >60)
Glucose, Bld: 109 mg/dL — ABNORMAL HIGH (ref 65–99)
Potassium: 3.3 mmol/L — ABNORMAL LOW (ref 3.5–5.1)
Sodium: 136 mmol/L (ref 135–145)

## 2014-12-06 LAB — VANCOMYCIN, TROUGH: Vancomycin Tr: 13 ug/mL (ref 10–20)

## 2014-12-06 NOTE — Progress Notes (Signed)
Pt has rested well today, medicated for pain several times and has slept in between care. Left lower leg remains pink and warm to touch, but pt has been afebrile, IV antibiotics given as scheduled

## 2014-12-06 NOTE — Progress Notes (Signed)
Pt. Alert and oriented. VSS. Up to bathroom with stand-by assist. Pain controlled with meds per MAR. Pt. Receiving IV antibiotics. Running SR-ST per telemetry monitor. IV infusing without difficulty. Pills whole with water. Tolerating PO's. Resting quietly. Will continue to monitor.

## 2014-12-06 NOTE — Progress Notes (Signed)
[facility] [place] [facility]   PATIENT NAME: Gina BeesMonica Campbell    MR#:  [id number]  DATE OF BIRTH:  02/24/1996  SUBJECTIVE:  Patient continues to have pain in her left leg. She says the cellulitis is not much improved.  REVIEW OF SYSTEMS:    Review of Systems  Constitutional: Negative for fever, chills and weight loss.  Respiratory: Negative for cough.   Cardiovascular: Negative for chest pain.  Gastrointestinal: Negative for abdominal pain.  Skin:       Cellulitis left leg  Neurological: Negative for dizziness.    Tolerating Diet: Yes      DRUG ALLERGIES:  No Known Allergies  VITALS:  Blood pressure 117/61, pulse 97, temperature 98 F (36.7 C), temperature source Oral, resp. rate 16, height 5\' 3"  (1.6 m), weight 105.053 kg (231 lb 9.6 oz), SpO2 98 %.  PHYSICAL EXAMINATION:   Physical Exam  Constitutional: She is oriented to person, place, and time and well-developed, well-nourished, and in no distress.  HENT:  Head: Normocephalic and atraumatic.  Eyes: Pupils are equal, round, and reactive to light. No scleral icterus.  Neck: Normal range of motion. Neck supple. No tracheal deviation present.  Cardiovascular: Normal rate and regular rhythm.   No murmur heard. Pulmonary/Chest: Breath sounds normal. She exhibits no tenderness.  Abdominal: Bowel sounds are normal. There is no tenderness.  Musculoskeletal: Normal range of motion. She exhibits edema. She exhibits no tenderness.  Neurological: She is alert and oriented to person, place, and time.  Skin:  LLE cellulitis still red hot       LABORATORY PANEL:   CBC  Recent Labs Lab 12/06/14 0322  WBC 12.8*  HGB 12.0  HCT 36.4  PLT 182   ------------------------------------------------------------------------------------------------------------------  Chemistries   Recent Labs Lab 12/05/14 0306 12/06/14 0322  NA 137 136  K 3.5 3.3*  CL 103 107  CO2 26 24  GLUCOSE 101*  109*  BUN 11 9  CREATININE 0.86 0.98  CALCIUM 8.9 8.0*  AST 23  --   ALT 13*  --   ALKPHOS 73  --   BILITOT 0.5  --    ------------------------------------------------------------------------------------------------------------------  Cardiac Enzymes  Recent Labs Lab 12/05/14 0306  TROPONINI <0.03   ------------------------------------------------------------------------------------------------------------------  RADIOLOGY:  Ct Angio Chest Pe W/cm &/or Wo Cm  12/05/2014     IMPRESSION: Examination is technically limited although no large central pulmonary emboli are visualized. No evidence of active pulmonary disease.   Electronically Signed   By: Burman NievesWilliam  Stevens M.D.   On: 12/05/2014 06:27   [place] Venous Img Lower Unilateral Left  12/05/2014     IMPRESSION: 1. No evidence of lower extremity deep vein thrombosis, LEFT.   Electronically Signed   By: Corlis Leak  Hassell M.D.   On: 12/05/2014 16:37     ASSESSMENT AND PLAN:   19 year old female with no significant past medical history who presented to the emergency department with left lower extremity cellulitis.  #1. Early sepsis: Patient presented with tachycardia, fever, and leukocytosis. Her white blood cell count is improving. She has been afebrile. Her heart rates are better controlled as well. She has a left lower extremity cellulitis which contributed to her early sepsis. Blood cultures are negative to date. Patient's on IV vancomycin and Zosyn which I will continue.  2. Chronic lower extremity swelling: [place] venous insufficiency. Patient uses compression stockings and when necessary fluid pill at home.     Management plans discussed with the patient and she  is in agreement.  CODE STATUS: Full  TOTAL TIME TAKING CARE OF THIS PATIENT: 35 minutes.   POSSIBLE D/C IN 1-2 DAYS, DEPENDING ON CLINICAL CONDITION.   Milo Solana M.D on 12/06/2014 at 11:33 AM  Between 7am to 6pm - Pager - [phone] After 6pm go to [email or web address]  - password [email or web address]  [facility] [place]  Office  [phone]  CC: Primary care physician; WHITE, Arlyss RepressELIZABETH BURNEY, NP

## 2014-12-07 ENCOUNTER — Inpatient Hospital Stay: Payer: Medicaid Other

## 2014-12-07 MED ORDER — SENNA 8.6 MG PO TABS
1.0000 | ORAL_TABLET | Freq: Every day | ORAL | Status: DC
  Administered 2014-12-07: 8.6 mg via ORAL
  Filled 2014-12-07: qty 1

## 2014-12-07 MED ORDER — GADOBENATE DIMEGLUMINE 529 MG/ML IV SOLN: 15.0000 mL | Freq: Once | INTRAVENOUS | Status: DC | PRN

## 2014-12-07 MED ORDER — SULFAMETHOXAZOLE-TRIMETHOPRIM 800-160 MG PO TABS: 1.0000 | ORAL_TABLET | Freq: Two times a day (BID) | ORAL | Status: DC

## 2014-12-07 MED ORDER — PROMETHAZINE HCL 25 MG/ML IJ SOLN
25.0000 mg | Freq: Once | INTRAMUSCULAR | Status: AC
  Administered 2014-12-07: 25 mg via INTRAVENOUS
  Filled 2014-12-07: qty 1

## 2014-12-07 MED ORDER — OXYCODONE-ACETAMINOPHEN 5-325 MG PO TABS: 1.0000 | ORAL_TABLET | Freq: Four times a day (QID) | ORAL | Status: DC | PRN

## 2014-12-07 MED ORDER — DOCUSATE SODIUM 100 MG PO CAPS
100.0000 mg | ORAL_CAPSULE | Freq: Two times a day (BID) | ORAL | Status: DC
  Administered 2014-12-07: 100 mg via ORAL
  Filled 2014-12-07: qty 1

## 2014-12-07 NOTE — Progress Notes (Signed)
Pt is a x o x3. C/o Leg pain. PRN pain meds givenx1. Slept well  durning night. VSS. Resting comfortably in bed without any distress.

## 2014-12-07 NOTE — Progress Notes (Addendum)
Completed discharge instructions with patient, gave prescriptions to patient with family present.  Significant other spoke up and stated they wanted copies of MRI as they were not happy that they did not have a reason or explanation for the cellulitis and [facility].  They mentioned previous visit back in 2013 where patient was discharged but was considered unstable [facility] due to CK-MB levels.  Patient has been getting up going to restroom with family as moderate fall risk and had not called with any issues in ambulating except for nausea upon returning to bed after using restroom.  Patients significant other stated that she is unable to walk and upon admission she was able to ambulate and they did not understand.  Also complained about service with cafeteria.  Directed question directly to patient and asked her is she would like for me to call the doctor and request further consultation and suspension of the discharge orders and patient wouldn't speak up.  Continued re questioning patient and patient finally spoke up to state she just wanted to go.  I reassured patient several times that I didn't mind calling the patient on her behalf to make sure she received the most appropriate care and she and family declined.    Called Dr. Juliene PinaMody to share situation and patient's family called out for doctors note for work and I advised them that I needed to call the doctor for that and I forwarded doctor to patients room so that she could address doctors note and concerns.  Dr. Juliene PinaMody Called back and stated that she had spoken directly with patient and patient still wishes to be discharged.  Proceeding with discharge instructions.

## 2014-12-07 NOTE — Progress Notes (Signed)
[facility]         [place]   12/07/2014  Patient: Gina Campbell   Date of Birth:  03/11/1996  Date of admission:  12/05/2014  Date of Discharge  12/07/2014    To Whom it May Concern:   Gina Campbell  may return to work on 12/14/13.  PHYSICAL ACTIVITY:  Full  If you have any questions or concerns, please don't hesitate to call.  Sincerely,   Altamese DillingVACHHANI, Harvest Deist M.D Pager [phone] Office : [phone]   .

## 2014-12-07 NOTE — Progress Notes (Signed)
ANTIBIOTIC CONSULT NOTE - INITIAL  Pharmacy Consult for Vancomycin/Zosyn  Indication: cellulitis  No Known Allergies  Patient Measurements: Height: 5\' 3"  (160 cm) Weight: 231 lb 9.6 oz (105.053 kg) IBW/kg (Calculated) : 52.4 Adjusted Body Weight: 60.5 kg  Vital Signs: Temp: 97.6 F (36.4 C) (05/15 1224) Temp Source: Oral (05/15 1224) BP: 106/74 mmHg (05/15 1224) Pulse Rate: 47 (05/15 1224) Intake/Output from previous day: 05/14 0701 - 05/15 0700 In: 225 [IV Piggyback:225] Out: -  Intake/Output from this shift:    Labs:  Recent Labs  12/05/14 0306 12/06/14 0322  WBC 19.7* 12.8*  HGB 12.7 12.0  PLT 223 182  CREATININE 0.86 0.98   Estimated Creatinine Clearance: 106.3 mL/min (by C-G formula based on Cr of 0.98).  Recent Labs  12/06/14 1836  VANCOTROUGH 13     Microbiology: Recent Results (from the past 720 hour(s))  Blood Culture (routine x 2)     Status: None (Preliminary result)   Collection Time: 12/05/14  3:11 AM  Result Value Ref Range Status   Specimen Description BLOOD  Final   Special Requests NONE  Final   Culture NO GROWTH 2 DAYS  Final   Report Status PENDING  Incomplete  Blood Culture (routine x 2)     Status: None (Preliminary result)   Collection Time: 12/05/14  3:55 AM  Result Value Ref Range Status   Specimen Description BLOOD  Final   Special Requests NONE  Final   Culture NO GROWTH 2 DAYS  Final   Report Status PENDING  Incomplete    Medical History: Past Medical History  Diagnosis Date  . Recurrent cellulitis of lower leg   . Obesity     Medications:  Scheduled:  . docusate sodium  100 mg Oral BID  . enoxaparin (LOVENOX) injection  40 mg Subcutaneous Q24H  . piperacillin-tazobactam  3.375 g Intravenous 3 times per day  . promethazine  25 mg Intravenous Once  . senna  1 tablet Oral Daily  . vancomycin  750 mg Intravenous Q8H   Assessment: Patient is a 19 yo female with orders for Vancomycin and Zosyn for treatment of  cellulitis.  Patient takes Penicillin oral for prophylaxis and is followed by ID as outpatient for chronic cellulitis.  Goal of Therapy:  Vancomycin trough level 10-15 mcg/ml  Plan:  Current orders for vancomycin 750mg  IV Q8H. Trough therapeutic, will continue.   Will continue Zosyn 3.375 gm IV q8h per EI protocol based on renal function.  Pharmacy will continue to follow.  Garlon HatchetJody Jekhi Bolin, PharmD Clinical Pharmacist   12/07/2014 12:44 PM

## 2014-12-07 NOTE — Discharge Summary (Signed)
[facility] [place] [facility]   PATIENT NAME: Gina BeesMonica Campbell    MR#:  [id number]  DATE OF BIRTH:  03/02/1996  DATE OF ADMISSION:  12/05/2014 ADMITTING PHYSICIAN: Crissie FiguresEdavally N Reddy, MD  DATE OF DISCHARGE: *12/07/2014  5:51 PM  PRIMARY CARE PHYSICIAN: WHITE, Arlyss RepressELIZABETH BURNEY, NP    ADMISSION DIAGNOSIS:  Leukocytosis [D72.829] SIRS (systemic inflammatory response syndrome) [A41.9] Chest pain [R07.9] Fever, unspecified fever cause [R50.9] Cellulitis and abscess of lower extremity [L02.419, L03.119]  DISCHARGE DIAGNOSIS:  Principal Problem:   Cellulitis of left leg Active Problems:   SIRS (systemic inflammatory response syndrome)   SECONDARY DIAGNOSIS:   Past Medical History  Diagnosis Date  . Recurrent cellulitis of lower leg   . Obesity     HOSPITAL COURSE:  19 year old female with past history significant for cellulitis of left lower extremity on PCN who presents with recurrent left lower extremity cellulitis.  1. Early sepsis: Patient presented with tachycardia, fever, and leukocytosis. Her white blood cell count has improved. She has been afebrile. Her heart rates are better controlled as well. She has a left lower extremity cellulitis which contributed to her early sepsis. Blood cultures are negative to date. Patient was on IV vancomycin and Zosyn. Her cellulitis has improved dramatically on this regime. However, she was still complaining of pain in the lower extremity. She had some tenderness, so I ordered an MRI to evaluate for possible abscess. The MRI showed cellulitis and NO abscess. I relayed the negative MRI results to the patient. The nurse called me to inform me that the patients's "friend" was concerned about her ambulation. I called the patient to ask her if she would like to stay another night due to the "pain" of her left leg. She stated that she did not want. So she was discharge with Bactrim. I am suspecting she may have MRSA given  that this is the third time she has had cellulitis.   2. Chronic lower extremity swelling: Likely to be venous insufficiency. Patient uses compression stockings and when necessary fluid pill at home.Dopper of the lower extremity was negative for DVT.  DISCHARGE CONDITIONS AND DIET:  STABLE  REGULAR DIET  CONSULTS OBTAINED:  NONE  DRUG ALLERGIES:  No Known Allergies  DISCHARGE MEDICATIONS:   Discharge Medication List as of 12/07/2014  3:44 PM    START taking these medications   Details  oxyCODONE-acetaminophen (ROXICET) 5-325 MG per tablet Take 1 tablet by mouth every 6 (six) hours as needed for moderate pain or severe pain., Starting 12/07/2014, Until Discontinued, Print    sulfamethoxazole-trimethoprim (BACTRIM DS,SEPTRA DS) 800-160 MG per tablet Take 1 tablet by mouth 2 (two) times daily., Starting 12/07/2014, Until Discontinued, Normal      CONTINUE these medications which have NOT CHANGED   Details  furosemide (LASIX) 20 MG tablet Take 20 mg by mouth daily., Until Discontinued, Historical Med      STOP taking these medications     penicillin v potassium (VEETID) 500 MG tablet               Today   CHIEF COMPLAINT:  Cellulitis improved. Pain on ambulation. + constipation   VITAL SIGNS:  Blood pressure 103/57, pulse 73, temperature 98.5 F (36.9 C), temperature source Oral, resp. rate 18, height 5\' 3"  (1.6 m), weight 105.053 kg (231 lb 9.6 oz), SpO2 99 %.   REVIEW OF SYSTEMS:  Review of Systems  Constitutional: Negative for fever and chills.  Respiratory: Negative  for cough and shortness of breath.   Cardiovascular: Positive for leg swelling. Negative for claudication.  Gastrointestinal: Positive for nausea and constipation. Negative for heartburn, vomiting and abdominal pain.  Musculoskeletal: Negative for myalgias and joint pain.  Skin:       LLE cellulitis  Neurological: Negative for dizziness and headaches.  Psychiatric/Behavioral: Negative for  depression.     PHYSICAL EXAMINATION:  GENERAL:  19 y.o.-year-old patient lying in the bed with no acute distress.  NECK:  Supple, no jugular venous distention. No thyroid enlargement, no tenderness.  LUNGS: Normal breath sounds bilaterally, no wheezing, rales,rhonchi  No use of accessory muscles of respiration.  CARDIOVASCULAR: S1, S2 normal. No murmurs, rubs, or gallops.  ABDOMEN: Soft, non-tender, non-distended. Bowel sounds present. No organomegaly or mass.  EXTREMITIES: +1+ edema PSYCHIATRIC: The patient is alert and oriented x 3.  SKIN: No obvious rash, lesion, or ulcer. Cellulitis has much improved. Some pain on palpation of lower shin no area of fluctuance noted.  DATA REVIEW:   CBC  Recent Labs Lab 12/06/14 0322  WBC 12.8*  HGB 12.0  HCT 36.4  PLT 182    Chemistries   Recent Labs Lab 12/05/14 0306 12/06/14 0322  NA 137 136  K 3.5 3.3*  CL 103 107  CO2 26 24  GLUCOSE 101* 109*  BUN 11 9  CREATININE 0.86 0.98  CALCIUM 8.9 8.0*  AST 23  --   ALT 13*  --   ALKPHOS 73  --   BILITOT 0.5  --     Cardiac Enzymes  Recent Labs Lab 12/05/14 0306  TROPONINI <0.03    Microbiology Results  @  RADIOLOGY:  Dg Abd 1 View  12/07/2014   CLINICAL DATA:  Lower abdominal pain, nausea and vomiting  EXAM: ABDOMEN - 1 VIEW  COMPARISON:  None.  FINDINGS: Gas-filled loops of large and small bowel which are nondistended. There is stool in the sigmoid colon rectum. No pathologic calcifications. No pathologic calcifications. No organomegaly.  IMPRESSION: No bowel obstruction.   Electronically Signed   By: Genevive Bi M.D.   On: 12/07/2014 15:16   Mr Ankle Left W Wo Contrast  12/07/2014   CLINICAL DATA:  Cellulitis of the left ankle. Redness and swelling. Pain.  EXAM: MRI OF THE LEFT ANKLE WITHOUT AND WITH CONTRAST  TECHNIQUE: Multiplanar, multisequence MR imaging of the ankle was performed before and after the administration of intravenous contrast.   CONTRAST:  15 cc MultiHance  COMPARISON:  Report of MRI dated 05/14/2008  FINDINGS: There is subcutaneous edema circumferentially around the ankle and extending onto the dorsum of the foot. There is enhancement of the subcutaneous soft tissues after contrast administration. There is no definable abscess. There is no osteomyelitis or joint effusion. Muscles and tendons are normal.  TENDONS  Peroneal: Normal.  Posteromedial: Normal.  Neck normal.  Anterior: Normal.  Achilles: Normal.  Plantar Fascia: Normal.  LIGAMENTS  Lateral: Normal.  Medial: Normal.  CARTILAGE  Ankle Joint: Normal.  Subtalar Joints/Sinus Tarsi: Normal.  Bones: Normal.  IMPRESSION: IMPRESSION Cellulitis of the subcutaneous fat of the distal left lower leg and dorsum of the foot. No abscesses.   Electronically Signed   By: Francene Boyers M.D.   On: 12/07/2014 12:10      Management plans discussed with the patient and she is in agreement. Stable for discharge home  Patient should follow up with PCP CODE STATUS:     Code Status Orders  Start     Ordered   12/05/14 0844  Full code   Continuous     12/05/14 0843      TOTAL TIME TAKING CARE OF THIS PATIENT: 40 minutes.    Arriana Lohmann M.D on 12/07/2014 at 8:52 PM  Between 7am to 6pm - Pager - [phone] After 6pm go to [email or web address] - password [email or web address] [facility]  [facility]  Office  [phone]  CC: Primary care physician; WHITE, Arlyss RepressELIZABETH BURNEY, NP

## 2014-12-10 LAB — CULTURE, BLOOD (ROUTINE X 2)
CULTURE: NO GROWTH
Culture: NO GROWTH

## 2015-08-25 LAB — OB RESULTS CONSOLE ABO/RH: RH TYPE: POSITIVE

## 2015-08-25 LAB — OB RESULTS CONSOLE VARICELLA ZOSTER ANTIBODY, IGG: VARICELLA IGG: IMMUNE

## 2015-08-25 LAB — OB RESULTS CONSOLE GC/CHLAMYDIA
Chlamydia: POSITIVE
Gonorrhea: NEGATIVE

## 2015-08-25 LAB — OB RESULTS CONSOLE HEPATITIS B SURFACE ANTIGEN: HEP B S AG: NEGATIVE

## 2015-08-25 LAB — OB RESULTS CONSOLE RPR: RPR: NONREACTIVE

## 2015-08-25 LAB — OB RESULTS CONSOLE RUBELLA ANTIBODY, IGM: RUBELLA: IMMUNE

## 2015-08-25 LAB — OB RESULTS CONSOLE HIV ANTIBODY (ROUTINE TESTING): HIV: NONREACTIVE

## 2015-08-25 LAB — OB RESULTS CONSOLE ANTIBODY SCREEN: ANTIBODY SCREEN: NEGATIVE

## 2015-10-31 ENCOUNTER — Observation Stay
Admission: EM | Admit: 2015-10-31 | Discharge: 2015-10-31 | Disposition: A | Discharge status: Home / Self Care | Payer: Medicaid Other | Attending: Advanced Practice Midwife | Admitting: Advanced Practice Midwife

## 2015-10-31 DIAGNOSIS — Z3A21 21 weeks gestation of pregnancy: Secondary | ICD-10-CM | POA: Diagnosis not present

## 2015-10-31 DIAGNOSIS — R309 Painful micturition, unspecified: Secondary | ICD-10-CM | POA: Insufficient documentation

## 2015-10-31 DIAGNOSIS — M549 Dorsalgia, unspecified: Secondary | ICD-10-CM | POA: Diagnosis not present

## 2015-10-31 DIAGNOSIS — O9989 Other specified diseases and conditions complicating pregnancy, childbirth and the puerperium: Secondary | ICD-10-CM | POA: Insufficient documentation

## 2015-10-31 DIAGNOSIS — O368190 Decreased fetal movements, unspecified trimester, not applicable or unspecified: Secondary | ICD-10-CM | POA: Diagnosis present

## 2015-10-31 DIAGNOSIS — O368120 Decreased fetal movements, second trimester, not applicable or unspecified: Principal | ICD-10-CM | POA: Insufficient documentation

## 2015-10-31 LAB — URINALYSIS COMPLETE WITH MICROSCOPIC (ARMC ONLY)
BACTERIA UA: NONE SEEN
Bilirubin Urine: NEGATIVE
Glucose, UA: NEGATIVE mg/dL
HGB URINE DIPSTICK: NEGATIVE
Ketones, ur: NEGATIVE mg/dL
Leukocytes, UA: NEGATIVE
Nitrite: NEGATIVE
PROTEIN: NEGATIVE mg/dL
SPECIFIC GRAVITY, URINE: 1.005 (ref 1.005–1.030)
pH: 7 (ref 5.0–8.0)

## 2015-10-31 NOTE — Final Progress Note (Signed)
Physician Final Progress Note  Patient ID: Gina SandiferMonica Campbell Campbell MRN: [id number] DOB/AGE: 22/12/1995 22 y.o.  Admit date: 10/31/2015 Admitting provider: Leola Brazilhelsea C Ward, MD Discharge date: 10/31/2015   Admission Diagnoses: IUP at 6151w2d with low back pain radiating to right leg, decreased fetal movement, burning with urination  Discharge Diagnoses:  Active Problems: IUP at 6351w2d with positive fetal heart tones and positive fetal movement back pain of pregnancy  Consults: None  Significant Findings/ Diagnostic Studies: labs:   Results for Gina Campbell, Gina Campbell (MRN [id number]) as of 10/31/2015 17:51  Ref. Range 10/31/2015 16:27  Appearance Latest Ref Range: CLEAR  CLEAR (A)  Bacteria, UA Latest Ref Range: NONE SEEN  NONE SEEN  Bilirubin Urine Latest Ref Range: NEGATIVE  NEGATIVE  Color, Urine Latest Ref Range: YELLOW  YELLOW (A)  Glucose Latest Ref Range: NEGATIVE mg/dL NEGATIVE  Hgb urine dipstick Latest Ref Range: NEGATIVE  NEGATIVE  Ketones, ur Latest Ref Range: NEGATIVE mg/dL NEGATIVE  Leukocytes, UA Latest Ref Range: NEGATIVE  NEGATIVE  Mucous Unknown PRESENT  Nitrite Latest Ref Range: NEGATIVE  NEGATIVE  pH Latest Ref Range: 5.0-8.0  7.0  Protein Latest Ref Range: NEGATIVE mg/dL NEGATIVE  RBC / HPF Latest Ref Range: 0-5 RBC/hpf 0-5  Specific Gravity, Urine Latest Ref Range: 1.005-1.030  1.005  Squamous Epithelial / LPF Latest Ref Range: NONE SEEN  0-5 (A)  WBC, UA Latest Ref Range: 0-5 WBC/hpf 0-5    Procedures: doppler heart tones Toco: no contractions  Discharge Condition: good  Disposition: 01-Home or Self Care  Diet: Regular diet  Discharge Activity: Activity as tolerated  Back pain of pregnancy instructions given to pt. Increase hydration Follow up with regular scheduled appointment     Medication List    ASK your doctor about these medications        furosemide 20 MG tablet  Commonly known as:  LASIX  Take 20 mg by mouth daily.     oxyCODONE-acetaminophen  5-325 MG tablet  Commonly known as:  ROXICET  Take 1 tablet by mouth every 6 (six) hours as needed for moderate pain or severe pain.     prenatal multivitamin Tabs tablet  Take 1 tablet by mouth daily at 12 noon.     sulfamethoxazole-trimethoprim 800-160 MG tablet  Commonly known as:  BACTRIM DS,SEPTRA DS  Take 1 tablet by mouth 2 (two) times daily.       Follow-up Information    Follow up with Marta AntuBrothers, Tamara, CNM On 11/18/2015.   Specialty:  Certified Nurse Midwife   Contact information:   [address] [place] [address] [phone]       Total time spent taking care of this patient: 15 minutes  Signed: Tresea MallGLEDHILL,Ethin Drummond, CNM  This patient and plan were discussed with Dr Elesa MassedWard 10/31/2015

## 2015-10-31 NOTE — OB Triage Note (Signed)
20 y.o. female presents today with complaint of decreased fetal movement for 2 days and back + suprapubic pain .  States that this started 2 days ago and at time she noticed a small amount of blood on toilet paper when wiped, but no bleeding since. States that she has back and suprapubic pain that is a 9/10 on pain scale. She states that nothing makes it better and laying down or moving makes it worse. At home has tried to make it better nothing for the pain.  On assessment FHR audible 150-160 range for one minute auscultated and ++ FM heard, but patient denied being able to feel it.  Abdominal assessment noted tenderness in suprapubic area with palpation, but no tenderness in other quadrants.  CVA tenderness present.  Nonpalpable kidneys.  Bowel sounds present all four quadrants.  Patient states the back pain that is present is constant and shoots down her legs.  She denies any dysuria or urinary frequency.  States with previous pregnancy she had constant UTI's and had to be put on preventative abx treatment.

## 2016-02-12 ENCOUNTER — Observation Stay
Admission: EM | Admit: 2016-02-12 | Discharge: 2016-02-12 | Disposition: A | Discharge status: Home / Self Care | Payer: Medicaid Other | Attending: Obstetrics & Gynecology | Admitting: Obstetrics & Gynecology

## 2016-02-12 DIAGNOSIS — Z6841 Body Mass Index (BMI) 40.0 and over, adult: Secondary | ICD-10-CM | POA: Diagnosis not present

## 2016-02-12 DIAGNOSIS — Z3A36 36 weeks gestation of pregnancy: Secondary | ICD-10-CM | POA: Insufficient documentation

## 2016-02-12 DIAGNOSIS — O9989 Other specified diseases and conditions complicating pregnancy, childbirth and the puerperium: Principal | ICD-10-CM | POA: Insufficient documentation

## 2016-02-12 DIAGNOSIS — E669 Obesity, unspecified: Secondary | ICD-10-CM | POA: Insufficient documentation

## 2016-02-12 DIAGNOSIS — O99213 Obesity complicating pregnancy, third trimester: Secondary | ICD-10-CM | POA: Insufficient documentation

## 2016-02-12 DIAGNOSIS — R51 Headache: Secondary | ICD-10-CM | POA: Diagnosis not present

## 2016-02-12 DIAGNOSIS — R030 Elevated blood-pressure reading, without diagnosis of hypertension: Secondary | ICD-10-CM | POA: Insufficient documentation

## 2016-02-12 DIAGNOSIS — M7989 Other specified soft tissue disorders: Secondary | ICD-10-CM | POA: Insufficient documentation

## 2016-02-12 LAB — PROTEIN / CREATININE RATIO, URINE
CREATININE, URINE: 151 mg/dL
Protein Creatinine Ratio: 0.24 mg/mg{Cre} — ABNORMAL HIGH (ref 0.00–0.15)
Total Protein, Urine: 36 mg/dL

## 2016-02-12 LAB — COMPREHENSIVE METABOLIC PANEL
ALBUMIN: 2.6 g/dL — AB (ref 3.5–5.0)
ALK PHOS: 106 U/L (ref 38–126)
ALT: 11 U/L — ABNORMAL LOW (ref 14–54)
ANION GAP: 7 (ref 5–15)
AST: 15 U/L (ref 15–41)
BUN: 7 mg/dL (ref 6–20)
CALCIUM: 8.6 mg/dL — AB (ref 8.9–10.3)
CO2: 22 mmol/L (ref 22–32)
CREATININE: 0.6 mg/dL (ref 0.44–1.00)
Chloride: 108 mmol/L (ref 101–111)
GFR calc non Af Amer: >60 mL/min (ref >60)
Glucose, Bld: 89 mg/dL (ref 65–99)
Potassium: 3.7 mmol/L (ref 3.5–5.1)
SODIUM: 137 mmol/L (ref 135–145)
TOTAL PROTEIN: 6.4 g/dL — AB (ref 6.5–8.1)

## 2016-02-12 LAB — CBC
HCT: 32.6 % — ABNORMAL LOW (ref 35.0–47.0)
HEMOGLOBIN: 10.9 g/dL — AB (ref 12.0–16.0)
MCH: 28.2 pg (ref 26.0–34.0)
MCHC: 33.4 g/dL (ref 32.0–36.0)
MCV: 84.3 fL (ref 80.0–100.0)
PLATELETS: 202 10*3/uL (ref 150–440)
RBC: 3.86 MIL/uL (ref 3.80–5.20)
RDW: 13.2 % (ref 11.5–14.5)
WBC: 13.8 10*3/uL — ABNORMAL HIGH (ref 3.6–11.0)

## 2016-02-12 NOTE — Plan of Care (Signed)
Pt seen by dr ward. Ok to discharge pt to home. Pt d/c home with d/c instructions for preeclampsia

## 2016-02-12 NOTE — Plan of Care (Signed)
Pt presents to l/d after being at [facility] with a 9lb weight/edema/HA. Will notify dr Elesa MassedWard and access pt.

## 2016-02-12 NOTE — Discharge Summary (Signed)
Gina Campbell is a 20 y.o. female. She is at [redacted]w[redacted]d gestation.  Chief Complaint: sent from office for high blood pressure and swelling/headaches  S: Resting comfortably. no CTX, no VB.no LOF,  Active fetal movement.   Location: - frontal headache, bilateral ankle swelling over the last week.  The swelling is persistent but the headache is intermittent.  5/10 at worst, relieved with tylenol, rest.  currently does not have one, but feels like one is building.  Nothing in particular makes is worse.  Denies: change of vision, difficulty breathing, right upper quadrant pain.   Maternal Medical History:   Past Medical History  Diagnosis Date  . Recurrent cellulitis of lower leg   . Obesity     Past Surgical History  Procedure Laterality Date  . Cesarean section      2014    No Known Allergies  Prior to Admission medications   Medication Sig Start Date End Date Taking? Authorizing Provider  furosemide (LASIX) 20 MG tablet Take 20 mg by mouth daily.    Historical Provider, MD  oxyCODONE-acetaminophen (ROXICET) 5-325 MG per tablet Take 1 tablet by mouth every 6 (six) hours as needed for moderate pain or severe pain. 12/07/14   Adrian Saran, MD  Prenatal Vit-Fe Fumarate-FA (PRENATAL MULTIVITAMIN) TABS tablet Take 1 tablet by mouth daily at 12 noon.    Historical Provider, MD  sulfamethoxazole-trimethoprim (BACTRIM DS,SEPTRA DS) 800-160 MG per tablet Take 1 tablet by mouth 2 (two) times daily. 12/07/14   Adrian Saran, MD     Prenatal care site: [facility]    Social History: She  reports that she has never smoked. She does not have any smokeless tobacco history on file. She reports that she does not drink alcohol or use illicit drugs.  Family History: family history includes Diabetes in her father and other.   Review of Systems: A full review of systems was performed and negative except as noted in the HPI.     O:  BP 139/121 mmHg  Pulse 149  Temp(Src) 98.6 F (37 C)  Ht 5'  2" (1.575 m)  Wt 121.564 kg (268 lb)  BMI 49.01 kg/m2  LMP  (LMP Unknown) Results for orders placed or performed during the hospital encounter of 02/12/16 (from the past 48 hour(s))  Comprehensive metabolic panel   Collection Time: 02/12/16  5:28 PM  Result Value Ref Range   Sodium 137 135 - 145 mmol/L   Potassium 3.7 3.5 - 5.1 mmol/L   Chloride 108 101 - 111 mmol/L   CO2 22 22 - 32 mmol/L   Glucose, Bld 89 65 - 99 mg/dL   BUN 7 6 - 20 mg/dL   Creatinine, Ser 4.09 0.44 - 1.00 mg/dL   Calcium 8.6 (L) 8.9 - 10.3 mg/dL   Total Protein 6.4 (L) 6.5 - 8.1 g/dL   Albumin 2.6 (L) 3.5 - 5.0 g/dL   AST 15 15 - 41 U/L   ALT 11 (L) 14 - 54 U/L   Alkaline Phosphatase 106 38 - 126 U/L   Total Bilirubin <0.1 (L) 0.3 - 1.2 mg/dL   GFR calc non Af Amer >60 >60 mL/min   GFR calc Af Amer >60 >60 mL/min   Anion gap 7 5 - 15  CBC   Collection Time: 02/12/16  5:28 PM  Result Value Ref Range   WBC 13.8 (H) 3.6 - 11.0 K/uL   RBC 3.86 3.80 - 5.20 MIL/uL   Hemoglobin 10.9 (L) 12.0 - 16.0  g/dL   HCT 09.832.6 (L) 11.935.0 - 14.747.0 %   MCV 84.3 80.0 - 100.0 fL   MCH 28.2 26.0 - 34.0 pg   MCHC 33.4 32.0 - 36.0 g/dL   RDW 82.913.2 56.211.5 - 13.014.5 %   Platelets 202 150 - 440 K/uL     Constitutional: NAD, AAOx3  HE/ENT: extraocular movements grossly intact, moist mucous membranes CV: RRR PULM: nl respiratory effort, CTABL     Abd: gravid, non-tender, non-distended, soft      Ext: Non-tender, Nonedmeatous   Psych: mood appropriate, speech normal Pelvic:  FHT: 145 mod + accels no decels TOCO: quiet    A/P:  Gina Campbell @ 36.1 with rule out preeclampsia.   Labor: not present.   Fetal Wellbeing: Reassuring Cat 1 tracing.  Blood pressures have been normal, liver enzymes/platelets normal, and P/c/ ration although pending would only be significant in the setting of elevated blood pressure, so baseline value for now.    Described signs and symptoms of preeclampsia to patient, to be on the lookout.    She likely  has headaches due to third spacing of fluid and is overall dehydrated.  Recommended electrolyte fluids like Gatorade.  Tylenol as well PRN  D/c home stable, precautions reviewed, follow-up as scheduled next Thursday, or call if symptoms worsen.   ----- Gina Plumberhelsea Magaline Steinberg, MD Attending Obstetrician and Gynecologist [facility]

## 2016-02-17 ENCOUNTER — Observation Stay
Admission: EM | Admit: 2016-02-17 | Discharge: 2016-02-17 | Disposition: A | Discharge status: Home / Self Care | Payer: Medicaid Other | Attending: Obstetrics and Gynecology | Admitting: Obstetrics and Gynecology

## 2016-02-17 DIAGNOSIS — O479 False labor, unspecified: Secondary | ICD-10-CM | POA: Diagnosis present

## 2016-02-17 DIAGNOSIS — Z3A37 37 weeks gestation of pregnancy: Secondary | ICD-10-CM | POA: Insufficient documentation

## 2016-02-17 DIAGNOSIS — O471 False labor at or after 37 completed weeks of gestation: Secondary | ICD-10-CM | POA: Diagnosis not present

## 2016-02-17 LAB — URINALYSIS COMPLETE WITH MICROSCOPIC (ARMC ONLY)
BILIRUBIN URINE: NEGATIVE
Bacteria, UA: NONE SEEN
GLUCOSE, UA: NEGATIVE mg/dL
HGB URINE DIPSTICK: NEGATIVE
KETONES UR: NEGATIVE mg/dL
Nitrite: NEGATIVE
PH: 7 (ref 5.0–8.0)
Protein, ur: NEGATIVE mg/dL
RBC / HPF: NONE SEEN RBC/hpf (ref 0–5)
Specific Gravity, Urine: 1.006 (ref 1.005–1.030)

## 2016-02-17 NOTE — OB Triage Note (Signed)
Patient came in for observation for labor evaluation. Patient reports irregular contractions that woke her out her sleep around 0000. Patient rates pain 7 out of 10. Patient states she has been feeling baby move fine. Patient denies leaking of fluid, vaginal bleeding and spotting. Vital signs stable and patient afebrile. FHR baseline 130 with moderate variability with accelerations 15 x 15 and no decelerations. Will continue to monitor.

## 2016-02-17 NOTE — Final Progress Note (Signed)
Physician Final Progress Note  Patient ID: Gina Campbell MRN: [id number] DOB/AGE: [birth year] 20 y.o.  Admit date: 02/17/2016 Admitting provider: Vena Austria, MD Discharge date: 02/17/2016   Admission Diagnoses: Irregular contractions  Discharge Diagnoses:  Active Problems:   Irregular contractions   Consults: None  Significant Findings/ Diagnostic Studies: labs:  Results for orders placed or performed during the hospital encounter of 02/17/16 (from the past 24 hour(s))  Urinalysis complete, with microscopic ([facility] only)     Status: Abnormal   Collection Time: 02/17/16  1:17 AM  Result Value Ref Range   Color, Urine YELLOW (A) YELLOW   APPearance HAZY (A) CLEAR   Glucose, UA NEGATIVE NEGATIVE mg/dL   Bilirubin Urine NEGATIVE NEGATIVE   Ketones, ur NEGATIVE NEGATIVE mg/dL   Specific Gravity, Urine 1.006 1.005 - 1.030   Hgb urine dipstick NEGATIVE NEGATIVE   pH 7.0 5.0 - 8.0   Protein, ur NEGATIVE NEGATIVE mg/dL   Nitrite NEGATIVE NEGATIVE   Leukocytes, UA 1+ (A) NEGATIVE   RBC / HPF NONE SEEN 0 - 5 RBC/hpf   WBC, UA 0-5 0 - 5 WBC/hpf   Bacteria, UA NONE SEEN NONE SEEN   Squamous Epithelial / LPF 0-5 (A) NONE SEEN   Mucous PRESENT     Procedures: reactive NST  Discharge Condition: good  Disposition: 01-Home or Self Care  Diet: Regular diet  Discharge Activity: Activity as tolerated  Discharge Instructions    Discharge activity:  No Restrictions    Complete by:  As directed   Fetal Kick Count:  Lie on our left side for one hour after a meal, and count the number of times your baby kicks.  If it is less than 5 times, get up, move around and drink some juice.  Repeat the test 30 minutes later.  If it is still less than 5 kicks in an hour, notify your doctor.    Complete by:  As directed   LABOR:  When conractions begin, you should start to time them from the beginning of one contraction to the beginning  of the next.  When contractions are 5 - 10 minutes apart  or less and have been regular for at least an hour, you should call your health care provider.    Complete by:  As directed   No sexual activity restrictions    Complete by:  As directed   Notify physician for bleeding from the vagina    Complete by:  As directed   Notify physician for blurring of vision or spots before the eyes    Complete by:  As directed   Notify physician for chills or fever    Complete by:  As directed   Notify physician for fainting spells, "black outs" or loss of consciousness    Complete by:  As directed   Notify physician for increase in vaginal discharge    Complete by:  As directed   Notify physician for leaking of fluid    Complete by:  As directed   Notify physician for pain or burning when urinating    Complete by:  As directed   Notify physician for pelvic pressure (sudden increase)    Complete by:  As directed   Notify physician for severe or continued nausea or vomiting    Complete by:  As directed   Notify physician for sudden gushing of fluid from the vagina (with or without continued leaking)    Complete by:  As directed   Notify physician  for sudden, constant, or occasional abdominal pain    Complete by:  As directed   Notify physician if baby moving less than usual    Complete by:  As directed       Medication List    STOP taking these medications   furosemide 20 MG tablet Commonly known as:  LASIX   oxyCODONE-acetaminophen 5-325 MG tablet Commonly known as:  ROXICET   sulfamethoxazole-trimethoprim 800-160 MG tablet Commonly known as:  BACTRIM DS,SEPTRA DS     TAKE these medications   prenatal multivitamin Tabs tablet Take 1 tablet by mouth daily at 12 noon.        Total time spent taking care of this patient: 15 minutes  Signed: Lorrene Reid 02/17/2016, 2:15 AM

## 2016-02-17 NOTE — Discharge Summary (Signed)
Patient discharged with instructions on follow up appointment, labor precautions, and when to seek medical attention. Patient ambulatory at discharge with steady gait and no complaints. Patient discharged by herself.

## 2016-03-02 ENCOUNTER — Other Ambulatory Visit: Payer: Medicaid Other

## 2016-03-02 ENCOUNTER — Encounter
Admission: RE | Admit: 2016-03-02 | Discharge: 2016-03-02 | Disposition: A | Discharge status: Home / Self Care | Payer: Medicaid Other | Source: Ambulatory Visit | Attending: Obstetrics and Gynecology | Admitting: Obstetrics and Gynecology

## 2016-03-02 HISTORY — DX: Effusion, unspecified ankle: M25.473

## 2016-03-02 LAB — CBC
HEMATOCRIT: 33.9 % — AB (ref 35.0–47.0)
HEMOGLOBIN: 11.5 g/dL — AB (ref 12.0–16.0)
MCH: 28.5 pg (ref 26.0–34.0)
MCHC: 33.8 g/dL (ref 32.0–36.0)
MCV: 84.3 fL (ref 80.0–100.0)
Platelets: 204 10*3/uL (ref 150–440)
RBC: 4.03 MIL/uL (ref 3.80–5.20)
RDW: 14 % (ref 11.5–14.5)
WBC: 10.2 10*3/uL (ref 3.6–11.0)

## 2016-03-02 LAB — TYPE AND SCREEN
ABO/RH(D): O POS
Antibody Screen: NEGATIVE
Extend sample reason: UNDETERMINED

## 2016-03-02 LAB — DIFFERENTIAL
BASOS ABS: 0 10*3/uL (ref 0–0.1)
BASOS PCT: 0 %
EOS ABS: 0.1 10*3/uL (ref 0–0.7)
EOS PCT: 1 %
LYMPHS ABS: 1.4 10*3/uL (ref 1.0–3.6)
Lymphocytes Relative: 13 %
MONOS PCT: 9 %
Monocytes Absolute: 1 10*3/uL — ABNORMAL HIGH (ref 0.2–0.9)
NEUTROS PCT: 77 %
Neutro Abs: 7.8 10*3/uL — ABNORMAL HIGH (ref 1.4–6.5)

## 2016-03-02 NOTE — Patient Instructions (Signed)
  Your procedure is scheduled on: March 03, 2016 (Thursday) Report to EMERGENCY DEPARTMENT To find out your arrival time please call [phone] between 1PM - 3PM on ARRIVAL TIME 5:30 AM.  Remember: Instructions that are not followed completely may result in serious medical risk, up to and including death, or upon the discretion of your surgeon and anesthesiologist your surgery may need to be rescheduled.    _x___ 1. Do not eat food or drink liquids after midnight. No gum chewing or hard candies.     _x___ 2. No Alcohol for 24 hours before or after surgery.   _x___ 3. Do Not Smoke For 24 Hours Prior to Your Surgery.   ____ 4. Bring all medications with you on the day of surgery if instructed.    _x___ 5. Notify your doctor if there is any change in your medical condition     (cold, fever, infections).       Do not wear jewelry, make-up, hairpins, clips or nail polish.  Do not wear lotions, powders, or perfumes. You may wear deodorant.  Do not shave 48 hours prior to surgery. Men may shave face and neck.  Do not bring valuables to the hospital.    [facility] belongings or valuables.               Contacts, dentures or bridgework may not be worn into surgery.  Leave your suitcase in the car. After surgery it may be brought to your room.  For patients admitted to the hospital, discharge time is determined by your                treatment team.   Patients discharged the day of surgery will not be allowed to drive home.   Please read over the following fact sheets that you were given:   Surgical Site Infection Prevention   ____ Take these medicines the morning of surgery with A SIP OF WATER:    1.   2.   3.   4.  5.  6.  ____ Fleet Enema (as directed)   _x__ Use CHG Soap as directed (SAGE CLOTHS)  ____ Use inhalers on the day of surgery  ____ Stop metformin 2 days prior to surgery    ____ Take 1/2 of usual insulin dose the night before  surgery and none on the morning of surgery.   _x___ Stop Coumadin/Plavix/aspirin on (NO ASPIRIN)  _x___ Stop Anti-inflammatories on (NO MOTRIN, ALEVE, ADVIL. IBUPROFEN, EXCEDRIN, GOODYS)OR ANY ASPIRIN PRODUCTS   ____ Stop supplements until after surgery.    ____ Bring C-Pap to the hospital.

## 2016-03-03 ENCOUNTER — Inpatient Hospital Stay
Admission: RE | Admit: 2016-03-03 | Discharge: 2016-03-06 | DRG: 765 | Disposition: A | Discharge status: Home / Self Care | Payer: Medicaid Other | Source: Ambulatory Visit | Attending: Obstetrics and Gynecology | Admitting: Obstetrics and Gynecology

## 2016-03-03 ENCOUNTER — Encounter
Admission: RE | Disposition: A | Discharge status: Home / Self Care | Payer: Self-pay | Source: Ambulatory Visit | Attending: Obstetrics and Gynecology

## 2016-03-03 ENCOUNTER — Inpatient Hospital Stay: Payer: Medicaid Other | Admitting: Anesthesiology

## 2016-03-03 DIAGNOSIS — Z98891 History of uterine scar from previous surgery: Secondary | ICD-10-CM

## 2016-03-03 DIAGNOSIS — O9081 Anemia of the puerperium: Secondary | ICD-10-CM | POA: Diagnosis not present

## 2016-03-03 DIAGNOSIS — D62 Acute posthemorrhagic anemia: Secondary | ICD-10-CM | POA: Diagnosis not present

## 2016-03-03 DIAGNOSIS — O34211 Maternal care for low transverse scar from previous cesarean delivery: Principal | ICD-10-CM | POA: Diagnosis present

## 2016-03-03 DIAGNOSIS — Z6841 Body Mass Index (BMI) 40.0 and over, adult: Secondary | ICD-10-CM

## 2016-03-03 DIAGNOSIS — O34219 Maternal care for unspecified type scar from previous cesarean delivery: Secondary | ICD-10-CM | POA: Diagnosis present

## 2016-03-03 DIAGNOSIS — O99214 Obesity complicating childbirth: Secondary | ICD-10-CM | POA: Diagnosis present

## 2016-03-03 DIAGNOSIS — Z3A39 39 weeks gestation of pregnancy: Secondary | ICD-10-CM

## 2016-03-03 DIAGNOSIS — E669 Obesity, unspecified: Secondary | ICD-10-CM | POA: Diagnosis present

## 2016-03-03 SURGERY — Surgical Case
Anesthesia: Spinal | Wound class: Clean Contaminated

## 2016-03-03 MED ORDER — DIPHENHYDRAMINE HCL 25 MG PO CAPS: 25.0000 mg | ORAL_CAPSULE | Freq: Four times a day (QID) | ORAL | Status: DC | PRN

## 2016-03-03 MED ORDER — SIMETHICONE 80 MG PO CHEW: 80.0000 mg | CHEWABLE_TABLET | ORAL | Status: DC

## 2016-03-03 MED ORDER — OXYTOCIN 40 UNITS IN LACTATED RINGERS INFUSION - SIMPLE MED
INTRAVENOUS | Status: AC
  Administered 2016-03-03: 40 [IU]
  Filled 2016-03-03: qty 1000

## 2016-03-03 MED ORDER — DIPHENHYDRAMINE HCL 25 MG PO CAPS: 25.0000 mg | ORAL_CAPSULE | ORAL | Status: DC | PRN

## 2016-03-03 MED ORDER — FENTANYL CITRATE (PF) 100 MCG/2ML IJ SOLN: 25.0000 ug | INTRAMUSCULAR | Status: DC | PRN

## 2016-03-03 MED ORDER — KETOROLAC TROMETHAMINE 30 MG/ML IJ SOLN
30.0000 mg | Freq: Four times a day (QID) | INTRAMUSCULAR | Status: AC | PRN
  Administered 2016-03-03 – 2016-03-04 (×3): 30 mg via INTRAVENOUS
  Filled 2016-03-03 (×3): qty 1

## 2016-03-03 MED ORDER — MEPERIDINE HCL 25 MG/ML IJ SOLN: 6.2500 mg | INTRAMUSCULAR | Status: DC | PRN

## 2016-03-03 MED ORDER — PRENATAL MULTIVITAMIN CH
1.0000 | ORAL_TABLET | Freq: Every day | ORAL | Status: DC
  Administered 2016-03-04 – 2016-03-06 (×3): 1 via ORAL
  Filled 2016-03-03 (×4): qty 1

## 2016-03-03 MED ORDER — SODIUM CHLORIDE 0.9% FLUSH: 3.0000 mL | INTRAVENOUS | Status: DC | PRN

## 2016-03-03 MED ORDER — SENNOSIDES-DOCUSATE SODIUM 8.6-50 MG PO TABS
2.0000 | ORAL_TABLET | ORAL | Status: DC
  Administered 2016-03-04 – 2016-03-06 (×2): 2 via ORAL
  Filled 2016-03-03 (×3): qty 2

## 2016-03-03 MED ORDER — DIBUCAINE 1 % RE OINT: 1.0000 "application " | TOPICAL_OINTMENT | RECTAL | Status: DC | PRN

## 2016-03-03 MED ORDER — SCOPOLAMINE 1 MG/3DAYS TD PT72: 1.0000 | MEDICATED_PATCH | Freq: Once | TRANSDERMAL | Status: DC

## 2016-03-03 MED ORDER — ONDANSETRON HCL 4 MG/2ML IJ SOLN: 4.0000 mg | Freq: Three times a day (TID) | INTRAMUSCULAR | Status: DC | PRN

## 2016-03-03 MED ORDER — ONDANSETRON HCL 4 MG/2ML IJ SOLN
INTRAMUSCULAR | Status: DC | PRN
  Administered 2016-03-03: 8 mg via INTRAVENOUS

## 2016-03-03 MED ORDER — SIMETHICONE 80 MG PO CHEW
80.0000 mg | CHEWABLE_TABLET | ORAL | Status: DC | PRN
  Filled 2016-03-03: qty 1

## 2016-03-03 MED ORDER — DIPHENHYDRAMINE HCL 50 MG/ML IJ SOLN
INTRAMUSCULAR | Status: DC | PRN
  Administered 2016-03-03: 50 mg via INTRAVENOUS

## 2016-03-03 MED ORDER — SIMETHICONE 80 MG PO CHEW
80.0000 mg | CHEWABLE_TABLET | Freq: Three times a day (TID) | ORAL | Status: DC
  Administered 2016-03-03 – 2016-03-06 (×8): 80 mg via ORAL
  Filled 2016-03-03 (×7): qty 1

## 2016-03-03 MED ORDER — PHENYLEPHRINE HCL 10 MG/ML IJ SOLN
INTRAMUSCULAR | Status: DC | PRN
  Administered 2016-03-03 (×4): 100 ug via INTRAVENOUS

## 2016-03-03 MED ORDER — SOD CITRATE-CITRIC ACID 500-334 MG/5ML PO SOLN
15.0000 mL | Freq: Three times a day (TID) | ORAL | Status: DC
  Administered 2016-03-03: 30 mL via ORAL

## 2016-03-03 MED ORDER — NALBUPHINE HCL 10 MG/ML IJ SOLN: 5.0000 mg | Freq: Once | INTRAMUSCULAR | Status: DC | PRN

## 2016-03-03 MED ORDER — FENTANYL CITRATE (PF) 100 MCG/2ML IJ SOLN
INTRAMUSCULAR | Status: DC | PRN
  Administered 2016-03-03: 12.5 ug via INTRATHECAL

## 2016-03-03 MED ORDER — BUPIVACAINE 0.25 % ON-Q PUMP DUAL CATH 400 ML
400.0000 mL | INJECTION | Status: DC
  Filled 2016-03-03: qty 400

## 2016-03-03 MED ORDER — ACETAMINOPHEN 325 MG PO TABS
650.0000 mg | ORAL_TABLET | ORAL | Status: DC | PRN
  Administered 2016-03-03: 650 mg via ORAL
  Filled 2016-03-03: qty 2

## 2016-03-03 MED ORDER — NALOXONE HCL 0.4 MG/ML IJ SOLN: 0.4000 mg | INTRAMUSCULAR | Status: DC | PRN

## 2016-03-03 MED ORDER — BUPIVACAINE HCL (PF) 0.5 % IJ SOLN
INTRAMUSCULAR | Status: DC | PRN
  Administered 2016-03-03: 10 mL

## 2016-03-03 MED ORDER — DIPHENHYDRAMINE HCL 50 MG/ML IJ SOLN: 12.5000 mg | INTRAMUSCULAR | Status: DC | PRN

## 2016-03-03 MED ORDER — OXYCODONE-ACETAMINOPHEN 5-325 MG PO TABS
1.0000 | ORAL_TABLET | ORAL | Status: DC | PRN
  Administered 2016-03-04 – 2016-03-06 (×5): 1 via ORAL
  Filled 2016-03-03 (×6): qty 1

## 2016-03-03 MED ORDER — IBUPROFEN 600 MG PO TABS
600.0000 mg | ORAL_TABLET | Freq: Four times a day (QID) | ORAL | Status: DC
  Administered 2016-03-04 – 2016-03-06 (×8): 600 mg via ORAL
  Filled 2016-03-03 (×8): qty 1

## 2016-03-03 MED ORDER — CEFAZOLIN SODIUM-DEXTROSE 2-4 GM/100ML-% IV SOLN
2.0000 g | Freq: Once | INTRAVENOUS | Status: AC
  Administered 2016-03-03: 2 g via INTRAVENOUS

## 2016-03-03 MED ORDER — LACTATED RINGERS IV SOLN: Freq: Once | INTRAVENOUS | Status: DC

## 2016-03-03 MED ORDER — EPHEDRINE SULFATE-NACL 50-0.9 MG/10ML-% IV SOSY
PREFILLED_SYRINGE | INTRAVENOUS | Status: DC | PRN
  Administered 2016-03-03 (×4): 5 mg via INTRAVENOUS

## 2016-03-03 MED ORDER — OXYTOCIN 40 UNITS IN LACTATED RINGERS INFUSION - SIMPLE MED
INTRAVENOUS | Status: DC | PRN
  Administered 2016-03-03: 700 mL via INTRAVENOUS

## 2016-03-03 MED ORDER — NALBUPHINE HCL 10 MG/ML IJ SOLN: 5.0000 mg | INTRAMUSCULAR | Status: DC | PRN

## 2016-03-03 MED ORDER — MENTHOL 3 MG MT LOZG
1.0000 | LOZENGE | OROMUCOSAL | Status: DC | PRN
  Filled 2016-03-03: qty 9

## 2016-03-03 MED ORDER — NALOXONE HCL 2 MG/2ML IJ SOSY
1.0000 ug/kg/h | PREFILLED_SYRINGE | INTRAVENOUS | Status: DC | PRN
  Filled 2016-03-03: qty 2

## 2016-03-03 MED ORDER — OXYCODONE-ACETAMINOPHEN 5-325 MG PO TABS
2.0000 | ORAL_TABLET | ORAL | Status: DC | PRN
  Administered 2016-03-04 – 2016-03-06 (×3): 2 via ORAL
  Filled 2016-03-03 (×2): qty 2

## 2016-03-03 MED ORDER — OXYTOCIN 40 UNITS IN LACTATED RINGERS INFUSION - SIMPLE MED
2.5000 [IU]/h | INTRAVENOUS | Status: AC
  Filled 2016-03-03: qty 1000

## 2016-03-03 MED ORDER — KETOROLAC TROMETHAMINE 30 MG/ML IJ SOLN: 30.0000 mg | Freq: Four times a day (QID) | INTRAMUSCULAR | Status: AC | PRN

## 2016-03-03 MED ORDER — LACTATED RINGERS IV SOLN
INTRAVENOUS | Status: DC
  Administered 2016-03-04: 01:00:00 via INTRAVENOUS

## 2016-03-03 MED ORDER — WITCH HAZEL-GLYCERIN EX PADS: 1.0000 "application " | MEDICATED_PAD | CUTANEOUS | Status: DC | PRN

## 2016-03-03 MED ORDER — ONDANSETRON HCL 4 MG/2ML IJ SOLN: 4.0000 mg | Freq: Once | INTRAMUSCULAR | Status: DC | PRN

## 2016-03-03 MED ORDER — COCONUT OIL OIL: 1.0000 "application " | TOPICAL_OIL | Status: DC | PRN

## 2016-03-03 MED ORDER — CEFAZOLIN SODIUM-DEXTROSE 2-4 GM/100ML-% IV SOLN
INTRAVENOUS | Status: AC
  Administered 2016-03-03: 2 g via INTRAVENOUS
  Filled 2016-03-03: qty 100

## 2016-03-03 MED ORDER — SOD CITRATE-CITRIC ACID 500-334 MG/5ML PO SOLN
ORAL | Status: AC
  Administered 2016-03-03: 30 mL via ORAL
  Filled 2016-03-03: qty 15

## 2016-03-03 MED ORDER — BUPIVACAINE HCL (PF) 0.5 % IJ SOLN
10.0000 mL | Freq: Once | INTRAMUSCULAR | Status: DC
  Filled 2016-03-03: qty 30

## 2016-03-03 MED ORDER — LACTATED RINGERS IV SOLN
INTRAVENOUS | Status: DC
  Administered 2016-03-03 (×3): via INTRAVENOUS

## 2016-03-03 SURGICAL SUPPLY — 27 items
BAG COUNTER SPONGE EZ (MISCELLANEOUS) ×2 IMPLANT
CANISTER SUCT 3000ML (MISCELLANEOUS) ×3 IMPLANT
CATH KIT ON-Q SILVERSOAK 5IN (CATHETERS) ×6 IMPLANT
CHLORAPREP W/TINT 26ML (MISCELLANEOUS) ×6 IMPLANT
CLOSURE WOUND 1/2 X4 (GAUZE/BANDAGES/DRESSINGS) ×1
COUNTER SPONGE BAG EZ (MISCELLANEOUS) ×1
DRSG TELFA 3X8 NADH (GAUZE/BANDAGES/DRESSINGS) ×3 IMPLANT
ELECT CAUTERY BLADE 6.4 (BLADE) IMPLANT
ELECT REM PT RETURN 9FT ADLT (ELECTROSURGICAL) ×3
ELECTRODE REM PT RTRN 9FT ADLT (ELECTROSURGICAL) ×1 IMPLANT
GAUZE SPONGE 4X4 12PLY STRL (GAUZE/BANDAGES/DRESSINGS) ×3 IMPLANT
GLOVE BIO SURGEON STRL SZ7 (GLOVE) ×12 IMPLANT
GLOVE INDICATOR 7.5 STRL GRN (GLOVE) ×9 IMPLANT
GOWN STRL REUS W/ TWL LRG LVL3 (GOWN DISPOSABLE) ×3 IMPLANT
GOWN STRL REUS W/TWL LRG LVL3 (GOWN DISPOSABLE) ×6
LIQUID BAND (GAUZE/BANDAGES/DRESSINGS) ×3 IMPLANT
NS IRRIG 1000ML POUR BTL (IV SOLUTION) ×3 IMPLANT
PACK C SECTION AR (MISCELLANEOUS) ×3 IMPLANT
PAD OB MATERNITY 4.3X12.25 (PERSONAL CARE ITEMS) ×3 IMPLANT
PAD PREP 24X41 OB/GYN DISP (PERSONAL CARE ITEMS) ×3 IMPLANT
STRIP CLOSURE SKIN 1/2X4 (GAUZE/BANDAGES/DRESSINGS) ×2 IMPLANT
SUCT VACUUM KIWI BELL (SUCTIONS) ×3 IMPLANT
SUT MNCRL AB 4-0 PS2 18 (SUTURE) ×3 IMPLANT
SUT PDS AB 1 TP1 96 (SUTURE) ×6 IMPLANT
SUT VIC AB 0 CTX 36 (SUTURE) ×6
SUT VIC AB 0 CTX36XBRD ANBCTRL (SUTURE) ×3 IMPLANT
SUT VIC AB 2-0 CT1 36 (SUTURE) ×3 IMPLANT

## 2016-03-03 NOTE — Anesthesia Procedure Notes (Signed)
Spinal  Patient location during procedure: OR Staffing Anesthesiologist: Berdine AddisonHOMAS, Diya Gervasi Performed: anesthesiologist  Preanesthetic Checklist Completed: patient identified, site marked, surgical consent, pre-op evaluation, timeout performed, IV checked and risks and benefits discussed Spinal Block Patient position: sitting Prep: Betadine Patient monitoring: heart rate, cardiac monitor, continuous pulse ox and blood pressure Approach: midline Location: L3-4 Injection technique: single-shot Needle Needle type: Pencil-Tip  Needle gauge: 25 G Needle length: 9 cm Assessment Sensory level: T10 Additional Notes Marcaine 12mg  and 25ug of fentanyl (No astromorph available).

## 2016-03-03 NOTE — Anesthesia Preprocedure Evaluation (Addendum)
Anesthesia Evaluation  Patient identified by MRN, date of birth, ID band Patient awake    Reviewed: Allergy & Precautions, NPO status , Patient's Chart, lab work & pertinent test results, reviewed documented beta blocker date and time   Airway Mallampati: III  TM Distance: >3 FB     Dental  (+) Chipped   Pulmonary           Cardiovascular      Neuro/Psych    GI/Hepatic   Endo/Other    Renal/GU      Musculoskeletal   Abdominal   Peds  Hematology   Anesthesia Other Findings Obese. Pt is anemic, Hb 11.5.  Reproductive/Obstetrics                            Anesthesia Physical Anesthesia Plan  ASA: III  Anesthesia Plan: Spinal   Post-op Pain Management:    Induction:   Airway Management Planned:   Additional Equipment:   Intra-op Plan:   Post-operative Plan:   Informed Consent: I have reviewed the patients History and Physical, chart, labs and discussed the procedure including the risks, benefits and alternatives for the proposed anesthesia with the patient or authorized representative who has indicated his/her understanding and acceptance.     Plan Discussed with: CRNA  Anesthesia Plan Comments:         Anesthesia Quick Evaluation

## 2016-03-03 NOTE — H&P (Signed)
Date of Initial paper H&P: 03/02/2016  History reviewed, patient examined, no change in status, stable for surgery.

## 2016-03-03 NOTE — Transfer of Care (Signed)
Immediate Anesthesia Transfer of Care Note  Patient: Gina Campbell  Procedure(s) Performed: Procedure(s): CESAREAN SECTION Baby Girl, 6lb, 5oz. (N/A)  Patient Location: PACU  Anesthesia Type:Spinal  Level of Consciousness: awake, alert , oriented and patient cooperative  Airway & Oxygen Therapy: Patient Spontanous Breathing  Post-op Assessment: Report given to RN, Post -op Vital signs reviewed and stable and Patient moving all extremities X 4  Post vital signs: Reviewed and stable  Last Vitals:  Vitals:   03/03/16 0610 03/03/16 0919  BP: 130/82 (!) 142/74  Pulse: 92 84  Resp: 18 20  Temp: 36.7 C 36.4 C    Last Pain:  Vitals:   03/03/16 0919  TempSrc: Oral  PainSc: 0-No pain         Complications: No apparent anesthesia complications

## 2016-03-03 NOTE — Op Note (Signed)
Preoperative Diagnosis: 1) 20 y.o. F6O1308 at [redacted]w[redacted]d 2) Prior Cesarean Section  Postoperative Diagnosis: 1) 20 y.o. [id number] at [redacted]w[redacted]d 2) Prior Cesarean Section  Operation Performed: Repeat low transverse C-section via pfannenstiel skin incision  Indiciation: Prior C-section desire repeat  Anesthesia: Spinal  Primary Surgeon: Vena Austria, MD   Assistant: Annamarie Major, MD  Preoperative Antibiotics: 2g Ancef  Estimated Blood Loss:  IV Fluids:  Urine Output::  Drains or Tubes: Foley to gravity drainage, ON-Q catheter system  Implants: none  Specimens Removed: none  Complications: none  Intraoperative Findings:  Normal tubes ovaries and uterus.  Delivery resulted in the birth of a liveborn female APGAR (1 MIN): 9   APGAR (5 MINS): 10, weight 6lbs 5oz  Patient Condition:stable  Procedure in Detail:  Patient was taken to the operating room were she was administered regional anesthesia.  She was positioned in the supine position, prepped and draped in the  Usual sterile fashion.  Prior to proceeding with the case a time out was performed and the level of anesthetic was checked and noted to be adequate.  Utilizing the scalpel a pfannenstiel skin incision was made 2cm above the pubic symphysis utilizing the patient's pre-existing scar and carried down sharply to the the level of the rectus fascia.  The fascia was incised in the midline using the scalpel and then extended using mayo scissors.  The superior border of the rectus fascia was grasped with two Kocher clamps and the underlying rectus muscles were dissected of the fascia using blunt dissection.  The median raphae was incised using Mayo scissors.   The inferior border of the rectus fascia was dissected of the rectus muscles in a similar fashion.  The midline was identified, the peritoneum was entered bluntly and expanded using manual tractions.  The uterus was noted to be in a none rotated position.  Next  the bladder blade was placed retracting the bladder caudally.  A bladder flap was not created.  A low transverse incision was scored on the lower uterine segment.  The hysterotomy was entered bluntly using the operators finger.  The hysterotomy incision was extended using manual traction.  The operators hand was placed within the hysterotomy position noting the fetus to be within the OA position.  The vertex was grasped, flexed, brought to the incision, and delivered a traumatically using a flat kiwi vacuum and fundal pressure.  The remainder of the body delivered with ease.  The infant was suctioned, cord was clamped and cut before handing off to the awaiting neonatologist.  The placenta was delivered using manual extraction.  The uterus was exteriorized, wiped clean of clots and debris using two moist laps.  The hysterotomy was closed using a two layer closure of 0 Vicryl, with the first being a running locked, the second a vertical imbricating.  The uterus was returned to the abdomen.  The peritoneal gutters were wiped clean of clots and debris using two moist laps.  The hysterotomy incision was re-inspected noted to be hemostatic.  TThe rectus muscles were inspected noted to be hemostatic.  The superior border of the rectus fascia was grasped with a Kocher clamp.  The ON-Q trocars were then placed 4cm above the superior border of the incision and tunneled subfascially.  The introducers were removed and the catheters were threaded through the sleeves after which the sleeves were removed.  The fascia was closed using a looped #1 PDS in a running fashion taking 1cm by 1cm bites.  The subcutaneous tissue was irrigated using warm saline, hemostasis achieved using the bovis.  The subcutaneous dead space was greater 3cm and was closed.  The sking was closed using 4-0 Monocryl in a subcuticular fashion.  Sponge needle and instrument counts were corrects times two.  The patient tolerated the procedure well and was taken  to the recovery room in stable condition.

## 2016-03-04 LAB — CBC
HEMATOCRIT: 28.9 % — AB (ref 35.0–47.0)
HEMOGLOBIN: 9.9 g/dL — AB (ref 12.0–16.0)
MCH: 29.1 pg (ref 26.0–34.0)
MCHC: 34.4 g/dL (ref 32.0–36.0)
MCV: 84.5 fL (ref 80.0–100.0)
Platelets: 159 10*3/uL (ref 150–440)
RBC: 3.42 MIL/uL — ABNORMAL LOW (ref 3.80–5.20)
RDW: 13.8 % (ref 11.5–14.5)
WBC: 7.9 10*3/uL (ref 3.6–11.0)

## 2016-03-04 LAB — RPR: RPR Ser Ql: NONREACTIVE

## 2016-03-04 LAB — HIV ANTIBODY (ROUTINE TESTING W REFLEX): HIV Screen 4th Generation wRfx: NONREACTIVE

## 2016-03-04 MED ORDER — OXYCODONE-ACETAMINOPHEN 5-325 MG PO TABS
ORAL_TABLET | ORAL | Status: AC
  Administered 2016-03-04: 2 via ORAL
  Filled 2016-03-04: qty 2

## 2016-03-04 NOTE — Progress Notes (Signed)
  Subjective:  Doing well, pain well controlled.  Minimal lochia.  No fevers, no chills  Objective:  Blood pressure 125/77, pulse 97, temperature 97.9 F (36.6 C), temperature source Oral, resp. rate 18, height 5\' 3"  (1.6 m), weight 117.9 kg (260 lb), SpO2 94 %, unknown if currently breastfeeding.  General: NAD Pulmonary: no increased work of breathing Abdomen: non-distended, non-tender, fundus firm at level of umbilicus Incision: D/C/I dressing Extremities: no edema, no erythema, no tenderness  Results for orders placed or performed during the hospital encounter of 03/03/16 (from the past 72 hour(s))  CBC     Status: Abnormal   Collection Time: 03/04/16  6:08 AM  Result Value Ref Range   WBC 7.9 3.6 - 11.0 K/uL   RBC 3.42 (L) 3.80 - 5.20 MIL/uL   Hemoglobin 9.9 (L) 12.0 - 16.0 g/dL   HCT 54.028.9 (L) 98.135.0 - 19.147.0 %   MCV 84.5 80.0 - 100.0 fL   MCH 29.1 26.0 - 34.0 pg   MCHC 34.4 32.0 - 36.0 g/dL   RDW 47.813.8 29.511.5 - 62.114.5 %   Platelets 159 150 - 440 K/uL     Assessment:   20 y.o. [id number] postoperativeday # 1 RLTCS   Plan:  1) Acute blood loss anemia - hemodynamically stable and asymptomatic - po ferrous sulfate  2)O pos / RI / VZI  3) TDAP status  12/25/15  4) Breast/Undecided  5) Disposition anticipated discharge POD3-4

## 2016-03-04 NOTE — Anesthesia Post-op Follow-up Note (Signed)
  Anesthesia Pain Follow-up Note  Patient: Gina Campbell  Day #: 1  Date of Follow-up: 03/04/2016 Time: 7:09 AM  Last Vitals:  Vitals:   03/04/16 0531 03/04/16 0703  BP: 116/68 125/77  Pulse: 99 97  Resp: 18 18  Temp: 36.8 C 36.6 C    Level of Consciousness: alert  Pain: mild   Side Effects:None  Catheter Site Exam: site not evaluated     Plan: D/C from anesthesia care  Clydene PughBeane, [name] D

## 2016-03-04 NOTE — Anesthesia Postprocedure Evaluation (Signed)
Anesthesia Post Note  Patient: Guy SandiferMonica L Covin  Procedure(s) Performed: Procedure(s) (LRB): CESAREAN SECTION Baby Girl, 6lb, 5oz. (N/A)  Patient location during evaluation: Mother Baby Anesthesia Type: Spinal Level of consciousness: awake and alert and oriented Pain management: satisfactory to patient Vital Signs Assessment: post-procedure vital signs reviewed and stable Respiratory status: respiratory function stable Cardiovascular status: stable Postop Assessment: no headache, no backache, spinal receding, no signs of nausea or vomiting, adequate PO intake and patient able to bend at knees Anesthetic complications: no    Last Vitals:  Vitals:   03/04/16 0531 03/04/16 0703  BP: 116/68 125/77  Pulse: 99 97  Resp: 18 18  Temp: 36.8 C 36.6 C    Last Pain:  Vitals:   03/04/16 0703  TempSrc: Oral  PainSc:                  Clydene PughBeane, Roma Bierlein D

## 2016-03-05 NOTE — Progress Notes (Signed)
Patient ID: Gina Campbell, female   DOB: 01/11/1996, 20 y.o.   MRN: [id number]  Subjective:  Doing well, pain well controlled.  Minimal lochia.  No fevers, no chills. Ambulating slowly but can do what she needs so far.  Objective:  BP 134/83   Pulse 92   Temp 98.3 F (36.8 C) (Oral)   Resp 20   Ht 5\' 3"  (1.6 m)   Wt 117.9 kg (260 lb)   LMP  (LMP Unknown)   SpO2 99%   Breastfeeding? Unknown   BMI 46.06 kg/m   General: NAD Pulmonary: no increased work of breathing Abdomen: non-distended, non-tender, fundus firm at level of umbilicus Incision: D/C/I dressing Extremities: no edema, no erythema, no tenderness  Recent Labs Lab 03/02/16 1222 03/04/16 0608  WBC 10.2 7.9  HGB 11.5* 9.9*  HCT 33.9* 28.9*  PLT 204 159     Assessment:   20 y.o. [id number] postoperativeday # 2 RLTCS, doing well overall   Plan:  1) Acute blood loss anemia - hemodynamically stable and asymptomatic - po ferrous sulfate  2)O pos / RI / VZI  3) TDAP status  12/25/15  4) Formula feeding/plans IUD  5) Disposition anticipated discharge POD3-4  Thomasene MohairStephen Kaleiyah Polsky, MD 03/05/2016 12:44 PM

## 2016-03-05 NOTE — Addendum Note (Signed)
Addendum  created 03/05/16 0910 by Alver FisherAmy Sheree Lalla, MD   Anesthesia Event edited

## 2016-03-06 LAB — CBC WITH DIFFERENTIAL/PLATELET
BASOS PCT: 1 %
Basophils Absolute: 0.1 10*3/uL (ref 0–0.1)
EOS ABS: 0.3 10*3/uL (ref 0–0.7)
EOS PCT: 5 %
HCT: 29.6 % — ABNORMAL LOW (ref 35.0–47.0)
Hemoglobin: 10 g/dL — ABNORMAL LOW (ref 12.0–16.0)
LYMPHS ABS: 1.4 10*3/uL (ref 1.0–3.6)
Lymphocytes Relative: 23 %
MCH: 28.9 pg (ref 26.0–34.0)
MCHC: 33.8 g/dL (ref 32.0–36.0)
MCV: 85.6 fL (ref 80.0–100.0)
Monocytes Absolute: 0.6 10*3/uL (ref 0.2–0.9)
Monocytes Relative: 10 %
Neutro Abs: 3.9 10*3/uL (ref 1.4–6.5)
Neutrophils Relative %: 61 %
PLATELETS: 166 10*3/uL (ref 150–440)
RBC: 3.46 MIL/uL — AB (ref 3.80–5.20)
RDW: 14.3 % (ref 11.5–14.5)
WBC: 6.3 10*3/uL (ref 3.6–11.0)

## 2016-03-06 MED ORDER — SIMETHICONE 80 MG PO CHEW: 80.0000 mg | CHEWABLE_TABLET | Freq: Three times a day (TID) | ORAL | 0 refills | Status: DC

## 2016-03-06 MED ORDER — OXYCODONE-ACETAMINOPHEN 5-325 MG PO TABS: 1.0000 | ORAL_TABLET | ORAL | 0 refills | Status: DC | PRN

## 2016-03-06 NOTE — Progress Notes (Signed)
Discharge instructions provided.  Pt and pt's mother verbalize understanding of all instructions and follow-up care.  Prescriptions given.  Pt discharged to home with infant at 1230 on 03/06/16 via wheelchair by CNA. Reynold BowenSusan Paisley Yoshua Geisinger, RN 03/06/2016 12:37 PM

## 2016-03-06 NOTE — Discharge Instructions (Signed)
Call your doctor for increased pain or vaginal bleeding, temperature above 100.4, depression, or concerns.  No strenuous activity or heavy lifting for 6 weeks.  No intercourse, tampons, douching, or enemas for 6 weeks.  No tub baths-showers only.  No driving for 2 weeks or while taking pain medications.  Continue prenatal vitamin and iron.  Keep incision clean and dry.  Call your doctor for incision concerns including redness, swelling, bleeding or drainage, or if begins to come apart.  Increase calories and fluids while breastfeeding. °

## 2016-03-06 NOTE — Discharge Summary (Signed)
Obstetric Discharge Summary  Reason for Admission: cesarean section Prenatal Procedures: ultrasound Intrapartum Procedures: cesarean: low cervical, transverse Postpartum Procedures: none Complications-Operative and Postpartum: none   Hemoglobin  Date Value Ref Range Status  03/06/2016 10.0 (L) 12.0 - 16.0 g/dL Final   HGB  Date Value Ref Range Status  01/29/2014 11.6 (L) 12.0 - 16.0 g/dL Final   HCT  Date Value Ref Range Status  03/06/2016 29.6 (L) 35.0 - 47.0 % Final  01/29/2014 37.1 35.0 - 47.0 % Final   BP 133/83 (BP Location: Right Arm)   Pulse 83   Temp 98.7 F (37.1 C) (Oral)   Resp 18   Ht 5\' 3"  (1.6 m)   Wt 117.9 kg (260 lb)   LMP  (LMP Unknown)   SpO2 100%   Bottle feeding   BMI 46.06 kg/m   Physical Exam:  General: alert, cooperative, appears stated age and no distress Lochia: appropriate Uterine Fundus: firm Incision: healing well DVT Evaluation: No evidence of DVT seen on physical exam.  Discharge Diagnoses: Term Pregnancy-delivered  Discharge Information: Date: 03/06/2016 Activity: pelvic rest, no driving for 2 weeks Diet: routine Medications: PNV, Ibuprofen and Percocet, simethicone Condition: stable Instructions: refer to practice specific booklet Discharge to: home   Follow-up Information    Lorrene ReidSTAEBLER, ANDREAS M, MD Follow up in 1 week(s).   Specialty:  Obstetrics and Gynecology Why:  For wound re-check Contact information: [address] [place] [address] [phone]          Newborn Data: Live born female  Birth Weight: 6 lb 4.9 oz (2860 g) APGAR: 9, 10  Home with mother.  Tresea MallGLEDHILL,Prarthana Parlin, CNM

## 2016-03-06 NOTE — Progress Notes (Signed)
Pt states that she received TDaP vaccine during pregnancy.  Pt declines TDaP vaccine at this time. Reynold BowenSusan Paisley Mohd Clemons, RN 03/06/2016 11:15 AM

## 2016-04-30 IMAGING — CR DG ABDOMEN 1V
1 series · 2 of 2 positions shown · non-contrast
Comparison: None.

CLINICAL DATA: Lower abdominal pain, nausea and vomiting

EXAM:
ABDOMEN - 1 VIEW

[Series 1: dg abd 1 view · 0.14mm/px · 2 of 2 slices shown]
[im 1/2]
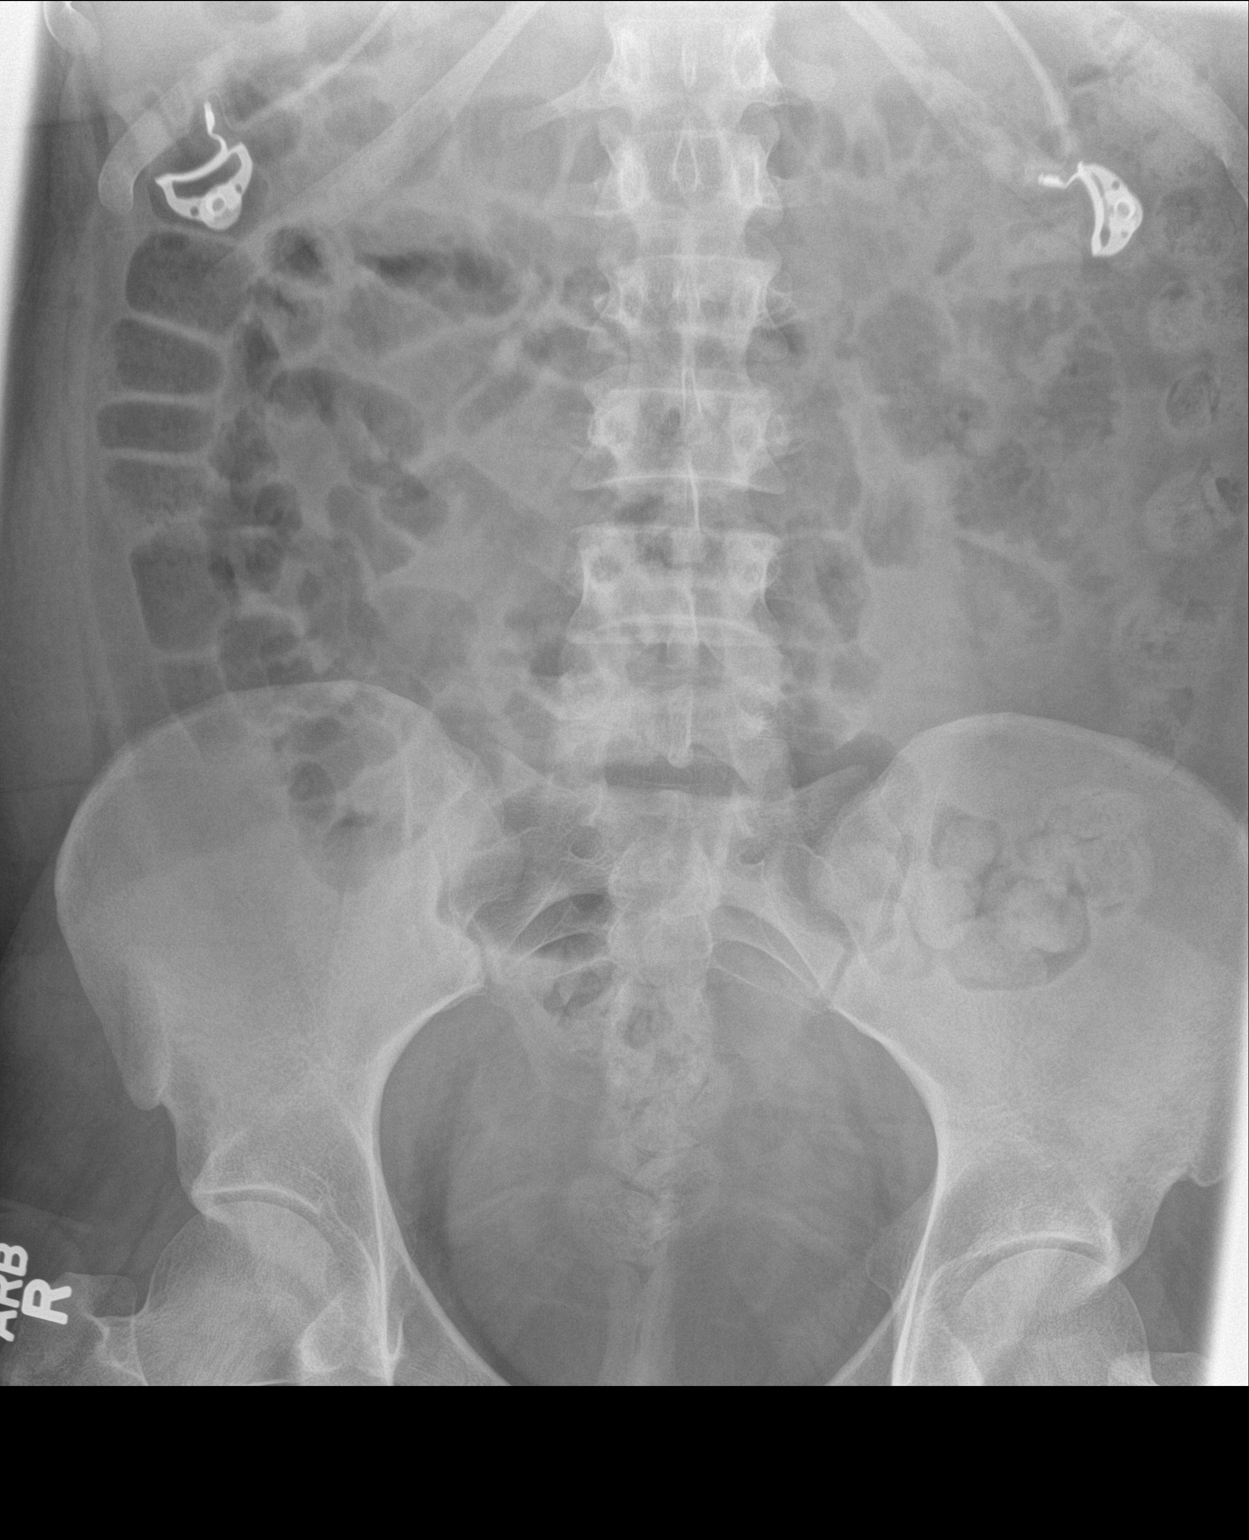
[im 2/2]
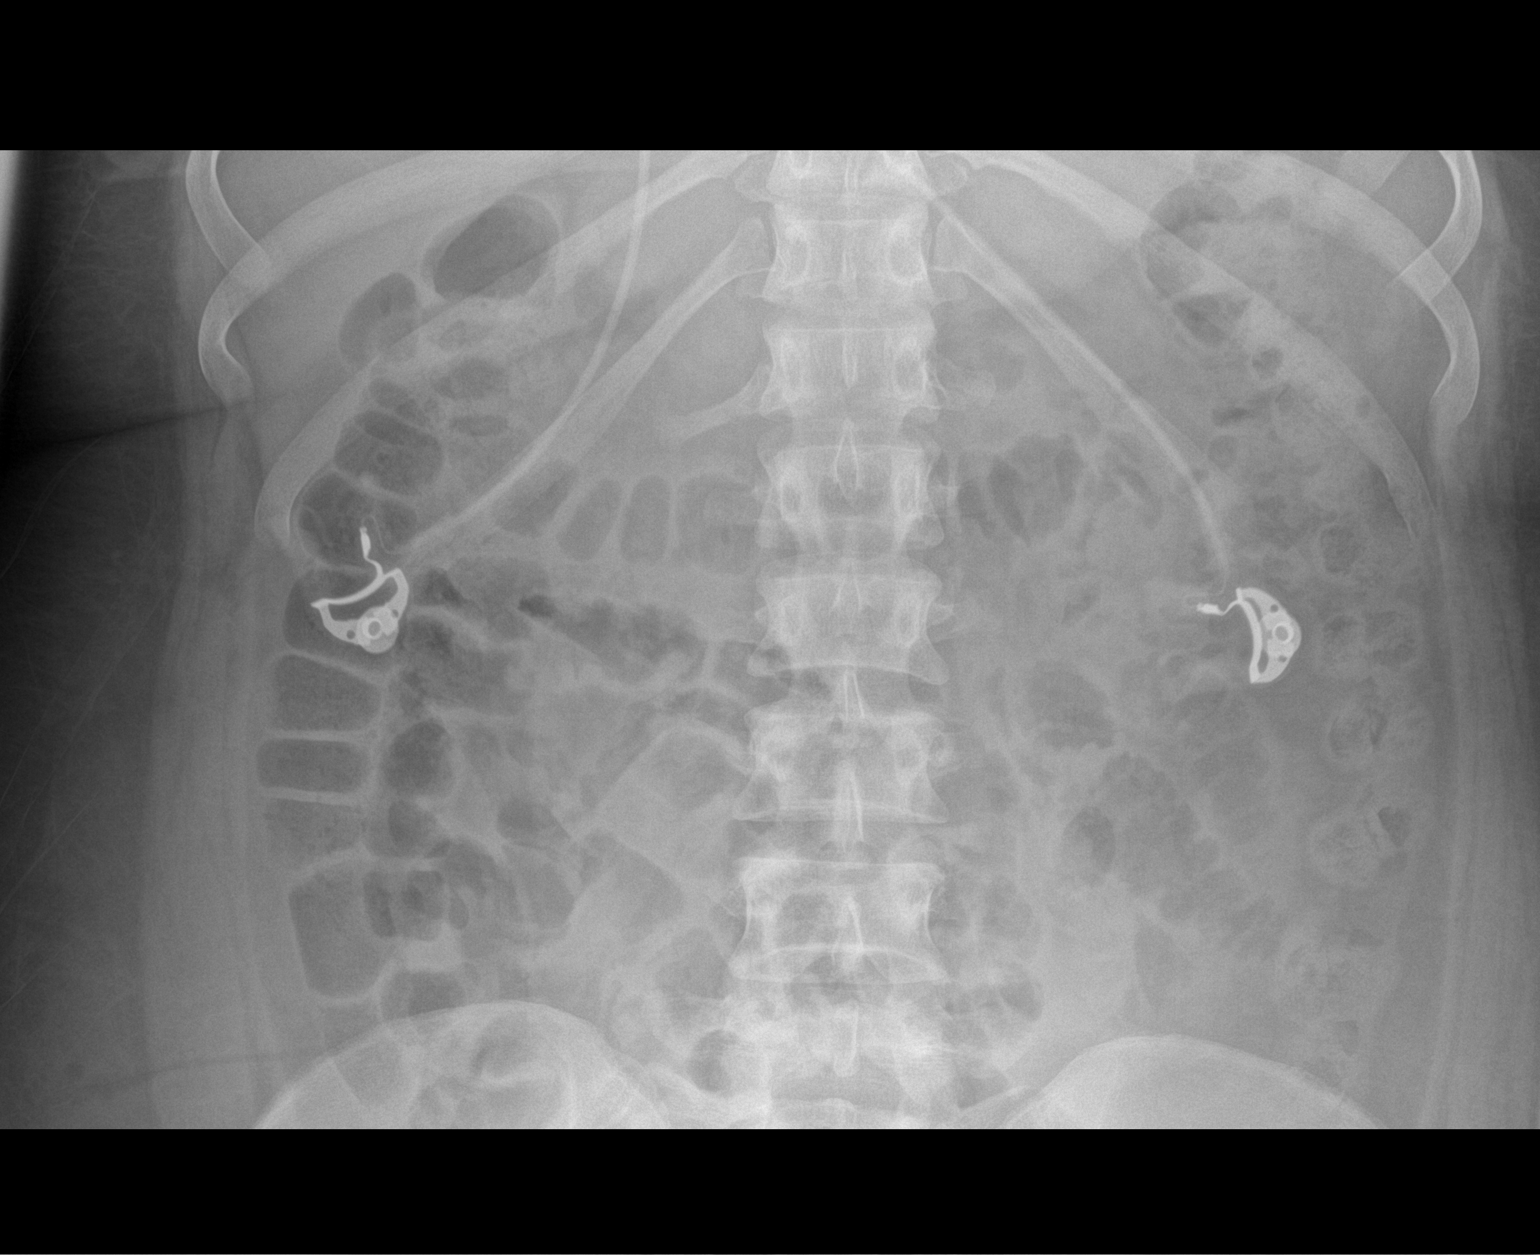

[2 of 2 positions shown; findings below may reference images not displayed]

FINDINGS: Gas-filled loops of large and small bowel which are nondistended.
There is stool in the sigmoid colon rectum. No pathologic
calcifications. No pathologic calcifications. No organomegaly.
IMPRESSION: No bowel obstruction.

## 2016-04-30 IMAGING — MR MR ANKLE*L* WO/W CM
8 of 9 series · 37 of 40 positions shown · IV contrast (15ml Multihance)
Comparison: Report of MRI dated 05/14/2008

CLINICAL DATA: Cellulitis of the left ankle. Redness and swelling.
Pain.

EXAM:
MRI OF THE LEFT ANKLE WITHOUT AND WITH CONTRAST
TECHNIQUE: Multiplanar, multisequence MR imaging of the ankle was performed
before and after the administration of intravenous contrast.
CONTRAST:  15 cc MultiHance

[Series 3: T1 · axial · 3.0mm · 0.56mm/px · z∈[-100,+59]mm · 4 of 45 slices shown (1 of 2)]
[im 1/45]
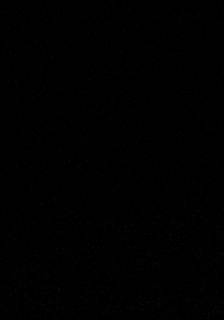
[im 15/45]
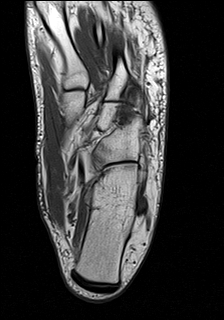
[im 30/45]
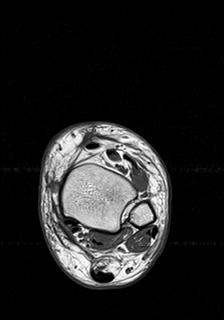
[im 45/45]
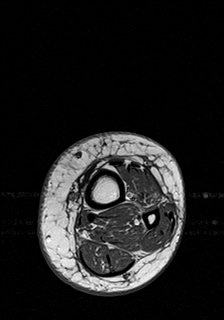

[Series 4: axial t1fs (only · axial · 3.0mm · 0.70mm/px · z∈[-100,+59]mm · 5 of 45 slices shown]
[im 1/45]
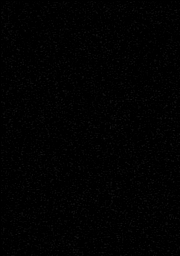
[im 12/45]
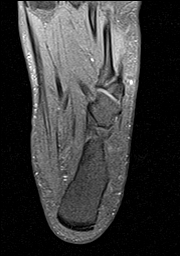
[im 23/45]
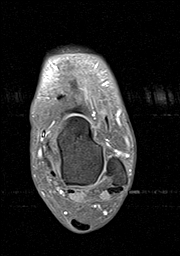
[im 34/45]
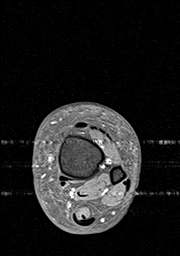
[im 45/45]
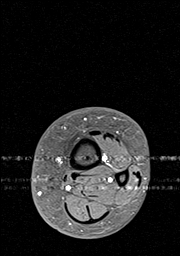

[Series 5: T2 fat-sat · axial · 3.0mm · 0.35mm/px · z∈[-99,+60]mm · 5 of 45 slices shown]
[im 1/45]
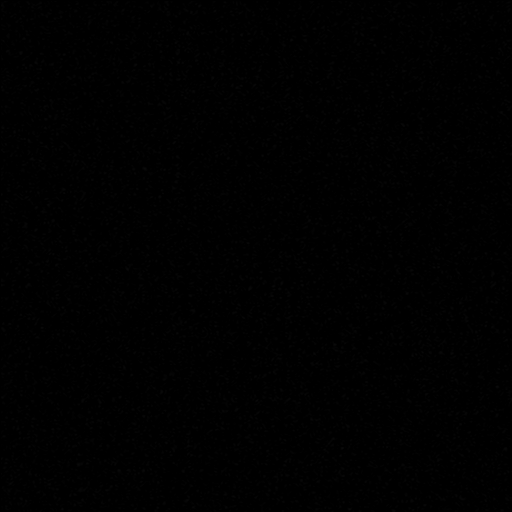
[im 12/45]
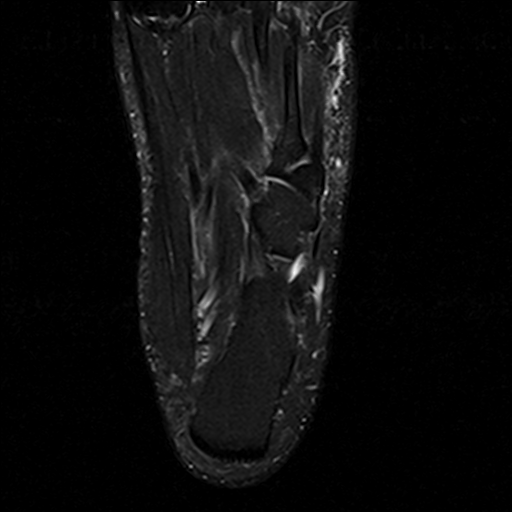
[im 23/45]
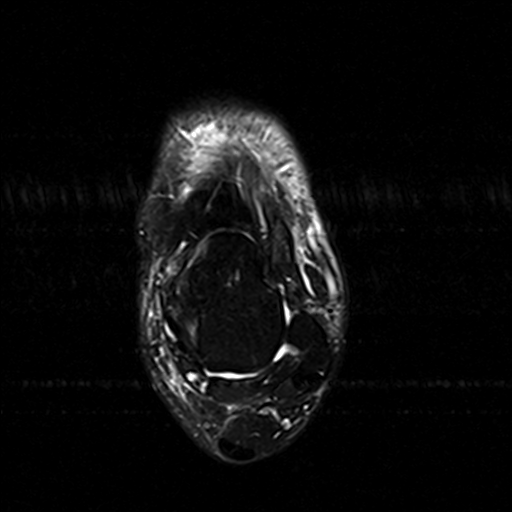
[im 34/45]
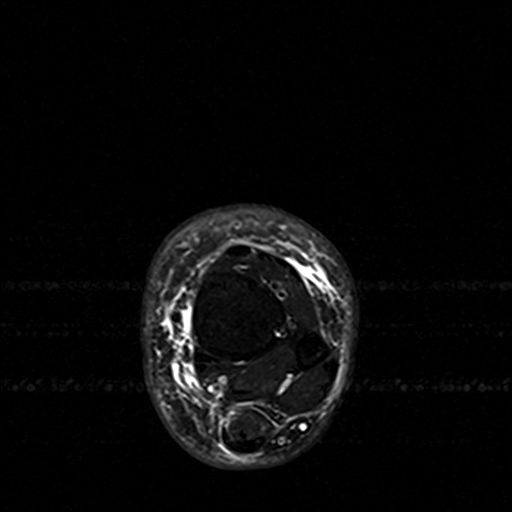
[im 45/45]
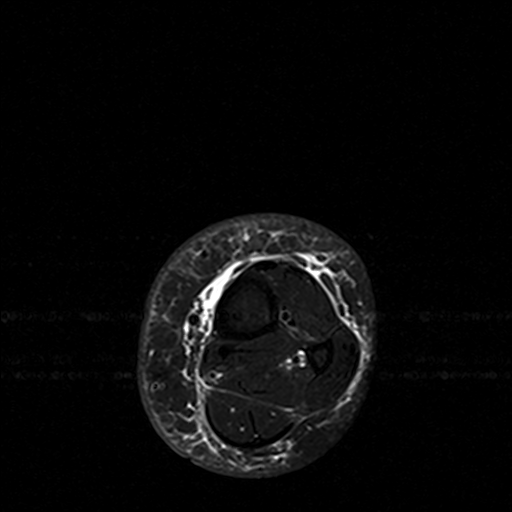

[Series 6: T1 · coronal · 3.0mm · 0.35mm/px · 5 of 45 slices shown (2 of 2)]
[im 1/45]
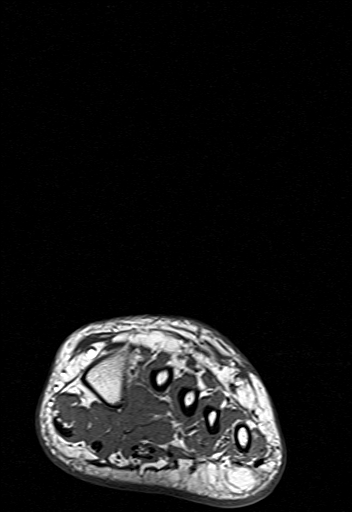
[im 12/45]
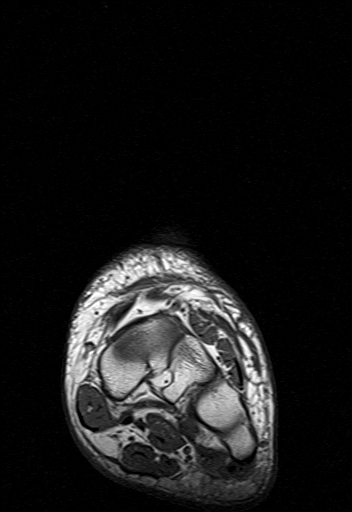
[im 23/45]
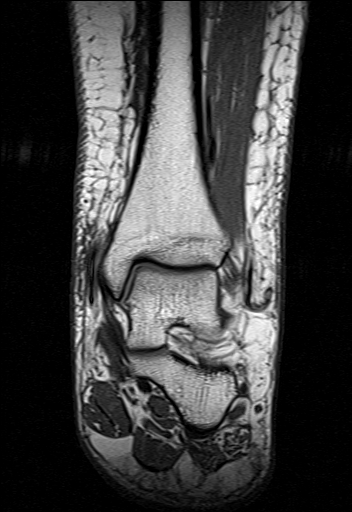
[im 34/45]
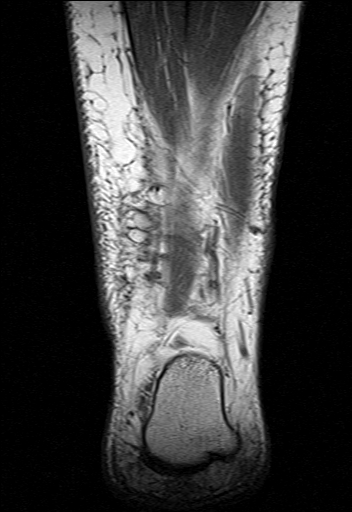
[im 45/45]
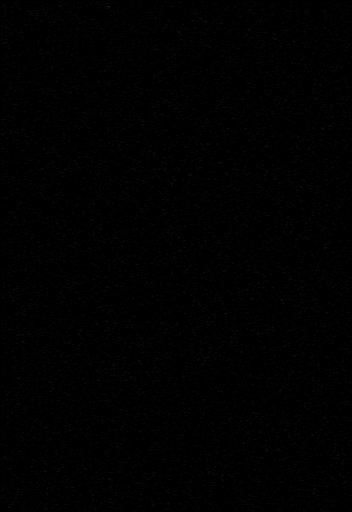

[Series 7: STIR · coronal · 3.0mm · 0.35mm/px · 5 of 45 slices shown]
[im 1/45]
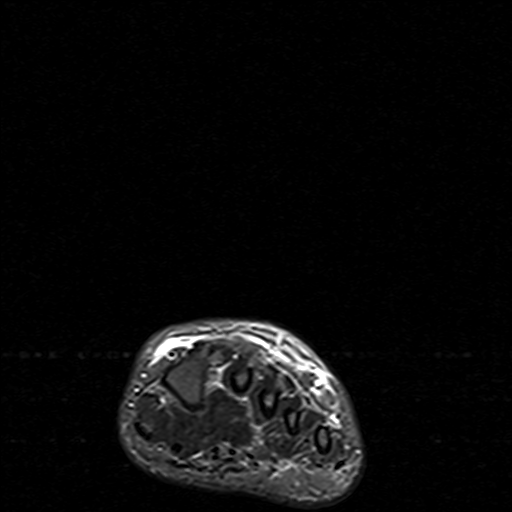
[im 12/45]
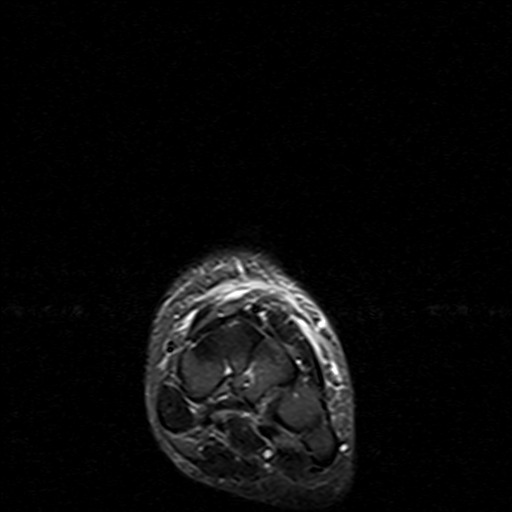
[im 23/45]
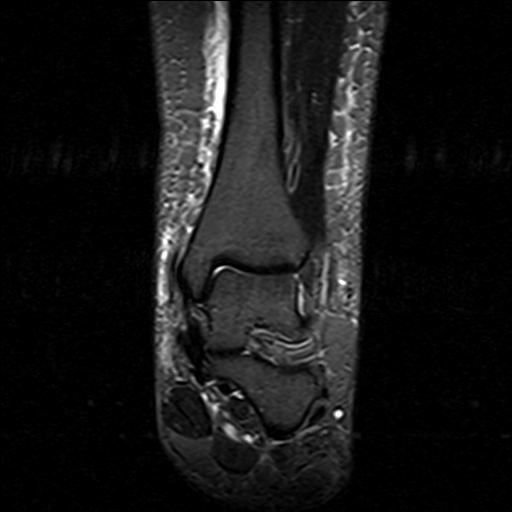
[im 34/45]
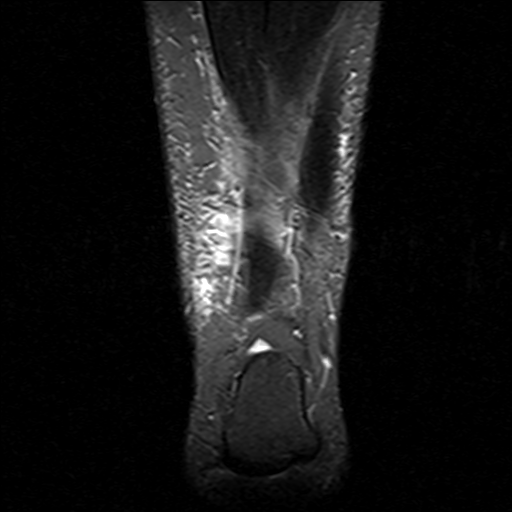
[im 45/45]
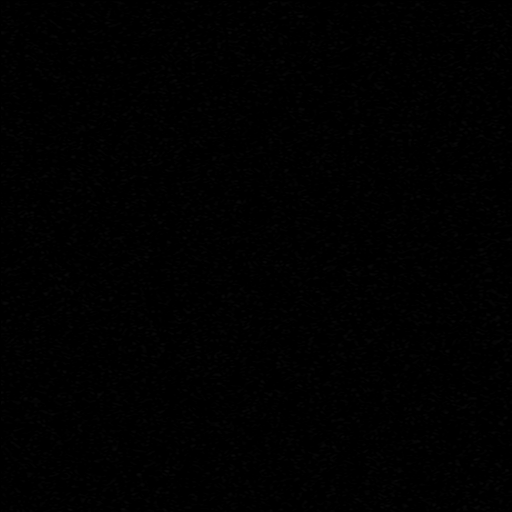

[Series 9: T1 fat-sat · axial · 3.0mm · 0.70mm/px · z∈[-100,+59]mm · 5 of 45 slices shown (1 of 3)]
[im 1/45]
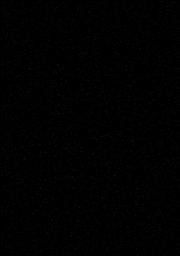
[im 12/45]
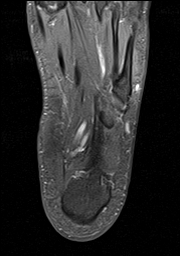
[im 23/45]
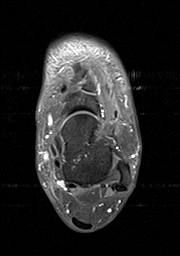
[im 34/45]
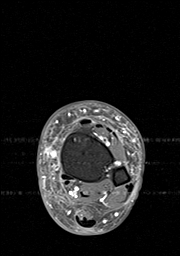
[im 45/45]
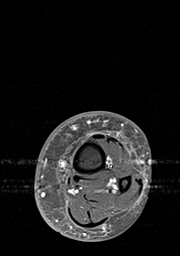

[Series 10: T1 fat-sat · coronal · 3.0mm · 0.70mm/px · 5 of 45 slices shown (2 of 3)]
[im 1/45]
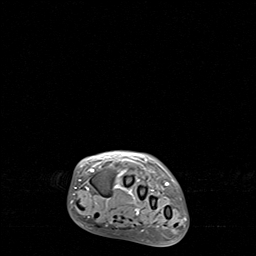
[im 12/45]
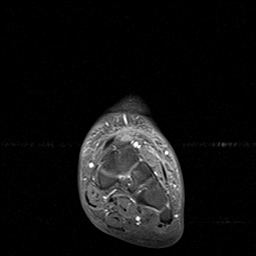
[im 23/45]
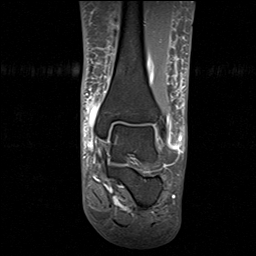
[im 34/45]
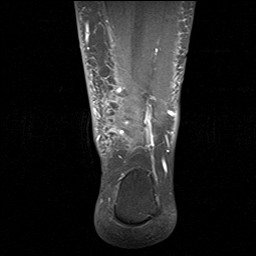
[im 45/45]
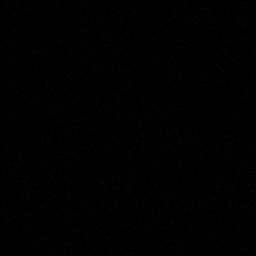

[Series 11: T1 fat-sat · sagittal · 3.0mm · 0.70mm/px · 3 of 28 slices shown (3 of 3)]
[im 1/28]
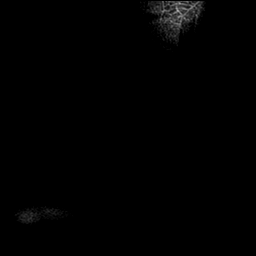
[im 14/28]
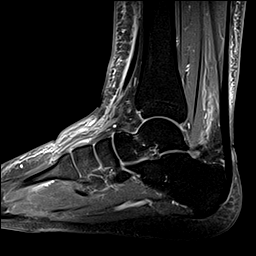
[im 28/28]
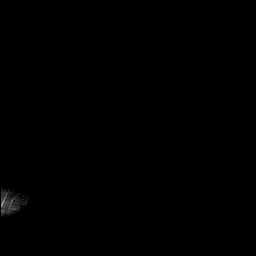

[37 of 40 positions shown; findings below may reference images not displayed]

FINDINGS: There is subcutaneous edema circumferentially around the ankle and
extending onto the dorsum of the foot. There is enhancement of the
subcutaneous soft tissues after contrast administration. There is no
definable abscess. There is no osteomyelitis or joint effusion.
Muscles and tendons are normal.

TENDONS

Peroneal: Normal.

Posteromedial: Normal.  Neck normal.

Anterior: Normal.

Achilles: Normal.

Plantar Fascia: Normal.

LIGAMENTS

Lateral: Normal.

Medial: Normal.

CARTILAGE

Ankle Joint: Normal.

Subtalar Joints/Sinus Tarsi: Normal.

Bones: Normal.
IMPRESSION: IMPRESSION
Cellulitis of the subcutaneous fat of the distal left lower leg and
dorsum of the foot. No abscesses.

## 2016-12-04 ENCOUNTER — Emergency Department
Admission: EM | Admit: 2016-12-04 | Discharge: 2016-12-05 | Disposition: A | Discharge status: Home / Self Care | Payer: Medicaid Other | Attending: Emergency Medicine | Admitting: Emergency Medicine

## 2016-12-04 DIAGNOSIS — Z79899 Other long term (current) drug therapy: Secondary | ICD-10-CM | POA: Insufficient documentation

## 2016-12-04 DIAGNOSIS — T401X1A Poisoning by heroin, accidental (unintentional), initial encounter: Secondary | ICD-10-CM | POA: Insufficient documentation

## 2016-12-04 DIAGNOSIS — L03114 Cellulitis of left upper limb: Secondary | ICD-10-CM | POA: Insufficient documentation

## 2016-12-04 DIAGNOSIS — L03119 Cellulitis of unspecified part of limb: Secondary | ICD-10-CM

## 2016-12-04 LAB — CBC WITH DIFFERENTIAL/PLATELET
BASOS ABS: 0 10*3/uL (ref 0–0.1)
BASOS PCT: 0 %
EOS PCT: 0 %
Eosinophils Absolute: 0 10*3/uL (ref 0–0.7)
HCT: 37.1 % (ref 35.0–47.0)
Hemoglobin: 12.3 g/dL (ref 12.0–16.0)
Lymphocytes Relative: 5 %
Lymphs Abs: 0.5 10*3/uL — ABNORMAL LOW (ref 1.0–3.6)
MCH: 28.2 pg (ref 26.0–34.0)
MCHC: 33.1 g/dL (ref 32.0–36.0)
MCV: 85 fL (ref 80.0–100.0)
MONO ABS: 0.5 10*3/uL (ref 0.2–0.9)
Monocytes Relative: 5 %
NEUTROS ABS: 9.8 10*3/uL — AB (ref 1.4–6.5)
Neutrophils Relative %: 90 %
PLATELETS: 225 10*3/uL (ref 150–440)
RBC: 4.37 MIL/uL (ref 3.80–5.20)
RDW: 13.4 % (ref 11.5–14.5)
WBC: 10.9 10*3/uL (ref 3.6–11.0)

## 2016-12-04 LAB — COMPREHENSIVE METABOLIC PANEL
ALBUMIN: 3.5 g/dL (ref 3.5–5.0)
ALT: 55 U/L — ABNORMAL HIGH (ref 14–54)
AST: 48 U/L — ABNORMAL HIGH (ref 15–41)
Alkaline Phosphatase: 53 U/L (ref 38–126)
Anion gap: 6 (ref 5–15)
BUN: 10 mg/dL (ref 6–20)
CHLORIDE: 109 mmol/L (ref 101–111)
CO2: 25 mmol/L (ref 22–32)
Calcium: 8.2 mg/dL — ABNORMAL LOW (ref 8.9–10.3)
Creatinine, Ser: 0.96 mg/dL (ref 0.44–1.00)
GFR calc Af Amer: >60 mL/min (ref >60)
GFR calc non Af Amer: >60 mL/min (ref >60)
GLUCOSE: 117 mg/dL — AB (ref 65–99)
POTASSIUM: 3.5 mmol/L (ref 3.5–5.1)
Sodium: 140 mmol/L (ref 135–145)
Total Bilirubin: 0.3 mg/dL (ref 0.3–1.2)
Total Protein: 7.1 g/dL (ref 6.5–8.1)

## 2016-12-04 LAB — ETHANOL: Alcohol, Ethyl (B): <5 mg/dL (ref <5)

## 2016-12-04 LAB — ACETAMINOPHEN LEVEL: Acetaminophen (Tylenol), Serum: <10 ug/mL — ABNORMAL LOW (ref 10–30)

## 2016-12-04 LAB — POCT PREGNANCY, URINE: PREG TEST UR: NEGATIVE

## 2016-12-04 LAB — SALICYLATE LEVEL: Salicylate Lvl: <7 mg/dL (ref 2.8–30.0)

## 2016-12-04 MED ORDER — NALOXONE HCL 0.4 MG/ML IJ SOLN
0.2000 mg | Freq: Once | INTRAMUSCULAR | Status: AC
  Administered 2016-12-04: 0.2 mg via INTRAVENOUS

## 2016-12-04 MED ORDER — SULFAMETHOXAZOLE-TRIMETHOPRIM 800-160 MG PO TABS: 2.0000 | ORAL_TABLET | Freq: Two times a day (BID) | ORAL | 0 refills | Status: DC

## 2016-12-04 MED ORDER — NALOXONE HCL 2 MG/2ML IJ SOSY
PREFILLED_SYRINGE | INTRAMUSCULAR | Status: AC
  Filled 2016-12-04: qty 2

## 2016-12-04 NOTE — ED Notes (Signed)
Dr. Pershing ProudSchaevitz at bedside.  Mom and boyfriend concerned about pt being sleepy and needing more narcan. Pt is arousable to speech. Pt oxygen dropped to 88% on RA with sleepiness, but when she wakes up her oxygen came back up with no oxygen provided.  Pt L hand is swollen and red, boyfriend states she has been complaining of L hand problems before today.

## 2016-12-04 NOTE — ED Triage Notes (Signed)
Pt arrives via [facility] from home for a heroin overdose. Pt states it was her first time using heroin. States it was injected. States "a friend of mine was doing it" states boyfriend called EMS. Pt arrives alert and oriented, speaking in complete sentences. Pt was narcaned by EMS. Total of 4mg  intranasal. EMS was called for unresponsive. FD found pt with agonal breathing. EMS found pt with ice packs and pop sickles down pants. Pt arrives freezing. Pt has bruising to R arm, R AC, R wrist, L wrist that pt states is from friend stating she missed the vein. PT denies SI and HI. Denies any other drug or alcohol use.

## 2016-12-04 NOTE — ED Provider Notes (Signed)
[facility] Emergency Department Provider Note  ____________________________________________   First MD Initiated Contact with Patient 12/04/16 2118     (approximate)  I have reviewed the triage vital signs and the nursing notes.   HISTORY  Chief Complaint Drug Overdose   HPI Gina Campbell is a 21 y.o. female with a history of IV drug use who is presenting to the emergency department tonight after an accidental overdose. She was shooting heroin tonight when she became unresponsive. EMS was called and upon arrival she was breathing about 4 breaths per minute. She was given 4 mg of intranasal Narcan. She has now regained her baseline mental status. She has no complaints except for feeling cold. However, ice was dumped on her prior to EMS arrival.   Past Medical History:  Diagnosis Date  . Ankle edema   . Obesity   . Recurrent cellulitis of lower leg     Patient Active Problem List   Diagnosis Date Noted  . S/P cesarean section 03/03/2016  . Irregular contractions 02/17/2016  . Labor and delivery, indication for care 02/12/2016  . Decreased fetal movement 10/31/2015  . Cellulitis of left leg 12/05/2014  . SIRS (systemic inflammatory response syndrome) (HCC) 12/05/2014    Past Surgical History:  Procedure Laterality Date  . CESAREAN SECTION     2014  . CESAREAN SECTION N/A 03/03/2016   Procedure: CESAREAN SECTION Baby Girl, 6lb, 5oz.;  Surgeon: Vena Austria, MD;  Location: [facility] ORS;  Service: Obstetrics;  Laterality: N/A;    Prior to Admission medications   Medication Sig Start Date End Date Taking? Authorizing Provider  etonogestrel (NEXPLANON) 68 MG IMPL implant Inject into the skin.   Yes [provider]  SUBOXONE 8-2 MG FILM Place 2 strips under the tongue daily. 09/07/16   [provider]  sulfamethoxazole-trimethoprim (BACTRIM DS,SEPTRA DS) 800-160 MG tablet Take 2 tablets by mouth 2 (two) times daily. 12/04/16    Stepen Prins, Myra Rude, MD    Allergies Patient has no known allergies.  Family History  Problem Relation Age of Onset  . Diabetes Father   . Diabetes Other     Social History Social History  Substance Use Topics  . Smoking status: Never Smoker  . Smokeless tobacco: Never Used  . Alcohol use No    Review of Systems  Constitutional: No fever/chills Eyes: No visual changes. ENT: No sore throat. Cardiovascular: Denies chest pain. Respiratory: Denies shortness of breath. Gastrointestinal: No abdominal pain.  No nausea, no vomiting.  No diarrhea.  No constipation. Genitourinary: Negative for dysuria. Musculoskeletal: Negative for back pain. Skin: Negative for rash. Neurological: Negative for headaches, focal weakness or numbness.   ____________________________________________   PHYSICAL EXAM:  VITAL SIGNS: ED Triage Vitals [12/04/16 2116]  Enc Vitals Group     BP      Pulse      Resp      Temp      Temp src      SpO2      Weight      Height      Head Circumference      Peak Flow      Pain Score 0     Pain Loc      Pain Edu?      Excl. in GC?     Constitutional: Alert and oriented. Well appearing and in no acute distress. Eyes: Conjunctivae are normal. PERRL. EOMI. Head: Atraumatic. Nose: No congestion/rhinnorhea. Mouth/Throat: Mucous membranes are moist.  Neck: No stridor.   Cardiovascular: Normal rate, regular rhythm. Grossly normal heart sounds.   Respiratory: Normal respiratory effort.  No retractions. Lungs CTAB. Gastrointestinal: Soft and nontender. No distention.  Musculoskeletal: No lower extremity tenderness nor edema.  No joint effusions. Neurologic:  Normal speech and language. No gross focal neurologic deficits are appreciated.  Skin:  Skin is dry and intact.   Track marks to the bilateral antecubital fossa. Ecchymosis to the right lateral and dorsal forearm. Patient admits to injecting over the sites.  Left hand dorsum with swelling,  ttp and erythema.  No fluctuance or pus.  Pt says hurting now for several days and is a past site of injection.    Psychiatric: Mood and affect are normal. Speech and behavior are normal.  ____________________________________________   LABS (all labs ordered are listed, but only abnormal results are displayed)  Labs Reviewed  CBC WITH DIFFERENTIAL/PLATELET - Abnormal; Notable for the following:       Result Value   Neutro Abs 9.8 (*)    Lymphs Abs 0.5 (*)    All other components within normal limits  COMPREHENSIVE METABOLIC PANEL  ETHANOL  ACETAMINOPHEN LEVEL  SALICYLATE LEVEL  URINALYSIS, COMPLETE (UACMP) WITH MICROSCOPIC  URINE DRUG SCREEN, QUALITATIVE ([facility] ONLY)  POC URINE PREG, ED   ____________________________________________  EKG  ED ECG REPORT I, Arelia LongestSchaevitz,  Laurieann Friddle M, the attending physician, personally viewed and interpreted this ECG.   Date: 12/04/2016  EKG Time: 2122  Rate: 112  Rhythm: sinus tachycardia  Axis: normal  Intervals:none  ST&T Change: No ST segment elevation or depression. No abnormal T-wave inversion.  ____________________________________________  RADIOLOGY   ____________________________________________   PROCEDURES  Procedure(s) performed:   Procedures  Critical Care performed:   ____________________________________________   INITIAL IMPRESSION / ASSESSMENT AND PLAN / ED COURSE  Pertinent labs & imaging results that were available during my care of the patient were reviewed by me and considered in my medical decision making (see chart for details).  We will observe the patient for 3-4 hours. Patient is understanding of this plan and willing to comply. She is also requesting information for detox.    12/04/16 1153  Pt awake and alert after being Given 2nd dose of narcan 2/2 drowsiness/difficult to rows.  Has capacity.  Cooperative.  Taken off of ivc.  Signed out to Dr. Zenda AlpersWebster.    ____________________________________________   FINAL CLINICAL IMPRESSION(S) / ED DIAGNOSES  Heroin overdose.  Hand cellulitis    NEW MEDICATIONS STARTED DURING THIS VISIT:  New Prescriptions   SULFAMETHOXAZOLE-TRIMETHOPRIM (BACTRIM DS,SEPTRA DS) 800-160 MG TABLET    Take 2 tablets by mouth 2 (two) times daily.     Note:  This document was prepared using Dragon voice recognition software and may include unintentional dictation errors.    Myrna BlazerSchaevitz, Tysheena Ginzburg Matthew, MD 12/04/16 [phone]

## 2016-12-04 NOTE — ED Notes (Signed)
Patient has been IVC'd by [employer] [facility] [place] [employer] [facility]  Pending disposition.

## 2016-12-04 NOTE — ED Notes (Signed)
Nurse attempted 2 IV sticks unsuccessfully.

## 2016-12-05 LAB — URINALYSIS, COMPLETE (UACMP) WITH MICROSCOPIC
BILIRUBIN URINE: NEGATIVE
Glucose, UA: 50 mg/dL — AB
Hgb urine dipstick: NEGATIVE
Ketones, ur: 5 mg/dL — AB
LEUKOCYTES UA: NEGATIVE
Nitrite: POSITIVE — AB
PH: 6 (ref 5.0–8.0)
Protein, ur: 30 mg/dL — AB
SPECIFIC GRAVITY, URINE: 1.019 (ref 1.005–1.030)

## 2016-12-05 LAB — URINE DRUG SCREEN, QUALITATIVE (ARMC ONLY)
AMPHETAMINES, UR SCREEN: NOT DETECTED
BARBITURATES, UR SCREEN: NOT DETECTED
Benzodiazepine, Ur Scrn: POSITIVE — AB
COCAINE METABOLITE, UR ~~LOC~~: NOT DETECTED
Cannabinoid 50 Ng, Ur ~~LOC~~: POSITIVE — AB
MDMA (ECSTASY) UR SCREEN: NOT DETECTED
METHADONE SCREEN, URINE: NOT DETECTED
OPIATE, UR SCREEN: POSITIVE — AB
Phencyclidine (PCP) Ur S: NOT DETECTED
Tricyclic, Ur Screen: NOT DETECTED

## 2016-12-05 MED ORDER — SULFAMETHOXAZOLE-TRIMETHOPRIM 800-160 MG PO TABS
1.0000 | ORAL_TABLET | Freq: Once | ORAL | Status: AC
  Administered 2016-12-05: 1 via ORAL
  Filled 2016-12-05: qty 1

## 2016-12-05 NOTE — ED Provider Notes (Signed)
-----------------------------------------   2:16 AM on 12/05/2016 -----------------------------------------   Blood pressure (!) 143/117, pulse 92, temperature 98.4 F (36.9 C), temperature source Oral, resp. rate 18, last menstrual period 11/04/2016, SpO2 92 %, unknown if currently breastfeeding.  Assuming care from Dr. Pershing ProudSchaevitz.  In short, Guy SandiferMonica L Buth is a 21 y.o. female with a chief complaint of Drug Overdose .  Refer to the original H&P for additional details.  The current plan of care is to monitor the patient for respiratory depression and Disposition the patient.  The patient had no respiratory depression in the emergency department multiple hours after receiving Narcan. The patient will be discharged to home.        Rebecka ApleyWebster, Hazley Dezeeuw P, MD 12/05/16 [phone]

## 2016-12-06 ENCOUNTER — Encounter: Payer: Self-pay | Admitting: Emergency Medicine

## 2016-12-06 ENCOUNTER — Emergency Department: Payer: Medicaid Other

## 2016-12-06 ENCOUNTER — Other Ambulatory Visit: Payer: Self-pay

## 2016-12-06 ENCOUNTER — Emergency Department
Admission: EM | Admit: 2016-12-06 | Discharge: 2016-12-06 | Disposition: A | Discharge status: Home / Self Care | Payer: Medicaid Other | Attending: Emergency Medicine | Admitting: Emergency Medicine

## 2016-12-06 DIAGNOSIS — Z79899 Other long term (current) drug therapy: Secondary | ICD-10-CM | POA: Insufficient documentation

## 2016-12-06 DIAGNOSIS — R0789 Other chest pain: Secondary | ICD-10-CM

## 2016-12-06 DIAGNOSIS — N12 Tubulo-interstitial nephritis, not specified as acute or chronic: Secondary | ICD-10-CM | POA: Diagnosis not present

## 2016-12-06 DIAGNOSIS — R112 Nausea with vomiting, unspecified: Secondary | ICD-10-CM

## 2016-12-06 DIAGNOSIS — J181 Lobar pneumonia, unspecified organism: Secondary | ICD-10-CM | POA: Insufficient documentation

## 2016-12-06 DIAGNOSIS — R109 Unspecified abdominal pain: Secondary | ICD-10-CM | POA: Diagnosis present

## 2016-12-06 DIAGNOSIS — L03114 Cellulitis of left upper limb: Secondary | ICD-10-CM

## 2016-12-06 DIAGNOSIS — J189 Pneumonia, unspecified organism: Secondary | ICD-10-CM

## 2016-12-06 LAB — CBC
HCT: 37.7 % (ref 35.0–47.0)
Hemoglobin: 12.4 g/dL (ref 12.0–16.0)
MCH: 27.8 pg (ref 26.0–34.0)
MCHC: 33 g/dL (ref 32.0–36.0)
MCV: 84.2 fL (ref 80.0–100.0)
PLATELETS: 232 10*3/uL (ref 150–440)
RBC: 4.47 MIL/uL (ref 3.80–5.20)
RDW: 13.7 % (ref 11.5–14.5)
WBC: 7.3 10*3/uL (ref 3.6–11.0)

## 2016-12-06 LAB — URINALYSIS, COMPLETE (UACMP) WITH MICROSCOPIC
BACTERIA UA: NONE SEEN
Bilirubin Urine: NEGATIVE
Glucose, UA: NEGATIVE mg/dL
HGB URINE DIPSTICK: NEGATIVE
Ketones, ur: NEGATIVE mg/dL
NITRITE: NEGATIVE
Protein, ur: 30 mg/dL — AB
Specific Gravity, Urine: 1.023 (ref 1.005–1.030)
pH: 6 (ref 5.0–8.0)

## 2016-12-06 LAB — POCT PREGNANCY, URINE: PREG TEST UR: NEGATIVE

## 2016-12-06 LAB — COMPREHENSIVE METABOLIC PANEL
ALBUMIN: 3.8 g/dL (ref 3.5–5.0)
ALK PHOS: 59 U/L (ref 38–126)
ALT: 39 U/L (ref 14–54)
AST: 27 U/L (ref 15–41)
Anion gap: 6 (ref 5–15)
BILIRUBIN TOTAL: 0.4 mg/dL (ref 0.3–1.2)
BUN: 8 mg/dL (ref 6–20)
CALCIUM: 8.9 mg/dL (ref 8.9–10.3)
CO2: 27 mmol/L (ref 22–32)
CREATININE: 0.98 mg/dL (ref 0.44–1.00)
Chloride: 107 mmol/L (ref 101–111)
GFR calc Af Amer: >60 mL/min (ref >60)
GFR calc non Af Amer: >60 mL/min (ref >60)
GLUCOSE: 100 mg/dL — AB (ref 65–99)
POTASSIUM: 4.4 mmol/L (ref 3.5–5.1)
Sodium: 140 mmol/L (ref 135–145)
Total Protein: 7.6 g/dL (ref 6.5–8.1)

## 2016-12-06 LAB — LIPASE, BLOOD: Lipase: 17 U/L (ref 11–51)

## 2016-12-06 LAB — TROPONIN I

## 2016-12-06 MED ORDER — METOCLOPRAMIDE HCL 10 MG PO TABS: 10.0000 mg | ORAL_TABLET | Freq: Three times a day (TID) | ORAL | 0 refills | Status: DC | PRN

## 2016-12-06 MED ORDER — DOXYCYCLINE HYCLATE 100 MG PO CAPS: 100.0000 mg | ORAL_CAPSULE | Freq: Two times a day (BID) | ORAL | 0 refills | Status: DC

## 2016-12-06 MED ORDER — PROMETHAZINE HCL 25 MG/ML IJ SOLN: 25.0000 mg | Freq: Once | INTRAMUSCULAR | Status: DC

## 2016-12-06 MED ORDER — IOPAMIDOL (ISOVUE-300) INJECTION 61%
100.0000 mL | Freq: Once | INTRAVENOUS | Status: AC | PRN
  Administered 2016-12-06: 100 mL via INTRAVENOUS

## 2016-12-06 MED ORDER — SODIUM CHLORIDE 0.9 % IV BOLUS (SEPSIS)
1000.0000 mL | Freq: Once | INTRAVENOUS | Status: AC
Start: 2016-12-06 — End: 2016-12-06
  Administered 2016-12-06: 1000 mL via INTRAVENOUS

## 2016-12-06 MED ORDER — DOXYCYCLINE HYCLATE 100 MG IV SOLR
100.0000 mg | Freq: Once | INTRAVENOUS | Status: AC
  Administered 2016-12-06: 100 mg via INTRAVENOUS
  Filled 2016-12-06: qty 100

## 2016-12-06 MED ORDER — ONDANSETRON HCL 4 MG/2ML IJ SOLN
4.0000 mg | Freq: Once | INTRAMUSCULAR | Status: AC
  Administered 2016-12-06: 4 mg via INTRAVENOUS
  Filled 2016-12-06: qty 2

## 2016-12-06 NOTE — ED Provider Notes (Signed)
[facility]-EMERGENCY DEPARTMENT Provider Note   CSN: [id number] Arrival date & time: 12/06/16  1118     History   Chief Complaint Chief Complaint  Patient presents with  . Nausea  . Emesis    HPI Gina Campbell is a 21 y.o. female hx of previous cellulitis, IV drug user here with persistent abdominal pain, flank pain, chest pain. Patient states that she was seen here 2 days ago for heroin overdose and required Narcan. She had abdominal pain at that time and possible urinary tract infection was sent home with Bactrim but she has not taken it. She was at work today and she felt lightheaded and dizzy and had chest pain and flank pain. She came here from work for evaluation. She is here with her boyfriend who is adamant that she did not take any more heroin since discharge. Of note, during her most recent ER visit, her UDS was positive for cocaine, marijuana, benzos.   The history is provided by the patient.    Past Medical History:  Diagnosis Date  . Ankle edema   . Obesity   . Recurrent cellulitis of lower leg     Patient Active Problem List   Diagnosis Date Noted  . S/P cesarean section 03/03/2016  . Irregular contractions 02/17/2016  . Labor and delivery, indication for care 02/12/2016  . Decreased fetal movement 10/31/2015  . Cellulitis of left leg 12/05/2014  . SIRS (systemic inflammatory response syndrome) (HCC) 12/05/2014    Past Surgical History:  Procedure Laterality Date  . CESAREAN SECTION     2014  . CESAREAN SECTION N/A 03/03/2016   Procedure: CESAREAN SECTION Baby Girl, 6lb, 5oz.;  Surgeon: Vena AustriaAndreas Staebler, MD;  Location: [facility] ORS;  Service: Obstetrics;  Laterality: N/A;    OB History    Gravida Para Term Preterm AB Living   2 2 2  0 0 2   SAB TAB Ectopic Multiple Live Births   0 0 0 0 2       Home Medications    Prior to Admission medications   Medication Sig Start Date End Date Taking? Authorizing Provider  etonogestrel (NEXPLANON) 68 MG IMPL  implant Inject into the skin.   Yes [provider]  SUBOXONE 8-2 MG FILM Place 2 strips under the tongue daily. 09/07/16  Yes [provider]  sulfamethoxazole-trimethoprim (BACTRIM DS,SEPTRA DS) 800-160 MG tablet Take 2 tablets by mouth 2 (two) times daily. 12/04/16  Yes Schaevitz, Myra Rudeavid Matthew, MD  doxycycline (VIBRAMYCIN) 100 MG capsule Take 1 capsule (100 mg total) by mouth 2 (two) times daily. One po bid x 7 days 12/06/16   Charlynne PanderYao, Carmon Sahli Hsienta, MD  metoCLOPramide (REGLAN) 10 MG tablet Take 1 tablet (10 mg total) by mouth every 8 (eight) hours as needed for nausea. 12/06/16 12/06/17  Charlynne PanderYao, Kalley Nicholl Hsienta, MD    Family History Family History  Problem Relation Age of Onset  . Diabetes Father   . Diabetes Other     Social History Social History  Substance Use Topics  . Smoking status: Never Smoker  . Smokeless tobacco: Never Used  . Alcohol use No     Allergies   Patient has no known allergies.   Review of Systems Review of Systems  Cardiovascular: Positive for chest pain.  Gastrointestinal: Positive for abdominal pain.  All other systems reviewed and are negative.    Physical Exam Updated Vital Signs BP 124/83   Pulse (!) 56   Temp 98.5 F (36.9 C) (Oral)  Resp (!) 22   Ht 5\' 3"  (1.6 m)   Wt 260 lb (117.9 kg)   LMP 11/06/2016 (Approximate)   SpO2 97%   BMI 46.06 kg/m   Physical Exam  Constitutional: She is oriented to person, place, and time.  Uncomfortable, MM slightly dry   HENT:  Head: Normocephalic.  MM slightly dry   Eyes: EOM are normal. Pupils are equal, round, and reactive to light.  Neck: Normal range of motion. Neck supple.  Cardiovascular: Normal rate, regular rhythm and normal heart sounds.   Pulmonary/Chest: Effort normal and breath sounds normal. No respiratory distress. She has no wheezes. She has no rales.  Abdominal: Soft. Bowel sounds are normal.  Mild bilateral CVAT   Musculoskeletal: Normal range of motion.  Mild L hand  swelling, + tract mark, ? Mild L hand cellulitis, no obvious abscess. 2 + pulses   Neurological: She is alert and oriented to person, place, and time.  Skin: Skin is warm.  Psychiatric: She has a normal mood and affect.  Nursing note and vitals reviewed.    ED Treatments / Results  Labs (all labs ordered are listed, but only abnormal results are displayed) Labs Reviewed  COMPREHENSIVE METABOLIC PANEL - Abnormal; Notable for the following:       Result Value   Glucose, Bld 100 (*)    All other components within normal limits  URINALYSIS, COMPLETE (UACMP) WITH MICROSCOPIC - Abnormal; Notable for the following:    Color, Urine YELLOW (*)    APPearance HAZY (*)    Protein, ur 30 (*)    Leukocytes, UA TRACE (*)    Squamous Epithelial / LPF 0-5 (*)    All other components within normal limits  LIPASE, BLOOD  CBC  TROPONIN I  POC URINE PREG, ED  POCT PREGNANCY, URINE    EKG  EKG Interpretation None      ED ECG REPORT I, Richardean Canal, the attending physician, personally viewed and interpreted this ECG.   Date: 12/06/2016  EKG Time: 14:02 pm  Rate: 70  Rhythm: normal EKG, normal sinus rhythm  Axis: normal  Intervals:none  ST&T Change: none   Radiology Ct Abdomen Pelvis W Contrast  Result Date: 12/06/2016 CLINICAL DATA:  Vomiting and abdominal pain EXAM: CT ABDOMEN AND PELVIS WITH CONTRAST TECHNIQUE: Multidetector CT imaging of the abdomen and pelvis was performed using the standard protocol following bolus administration of intravenous contrast. CONTRAST:  ISOVUE-300 IOPAMIDOL (ISOVUE-300) INJECTION 61% COMPARISON:  None. FINDINGS: Lower chest:  Airspace disease in the right lower lobe. Hepatobiliary: No focal liver abnormality.No evidence of biliary obstruction or stone. Pancreas: Unremarkable. Spleen: Unremarkable. Adrenals/Urinary Tract: Negative adrenals. No hydronephrosis or stone. Unremarkable bladder. Stomach/Bowel:  No obstruction. No appendicitis.  Vascular/Lymphatic: No acute vascular abnormality. No mass or adenopathy. Reproductive:36 mm right ovarian cyst. The surrounding ovarian parenchyma does not appear thickened or edematous compared to the left. Other: No ascites or pneumoperitoneum. Abdominal wall scarring from Cesarean section. Musculoskeletal: No acute abnormalities. Chronic appearing L5 right lamina defect. IMPRESSION: 1. Airspace disease in the right lower lobe could be pneumonia or aspiration. 2. No acute intra-abdominal finding. 3. 36 mm right ovarian cyst with simple CT appearance. Electronically Signed   By: Marnee Spring M.D.   On: 12/06/2016 15:06    Procedures Procedures (including critical care time)  Medications Ordered in ED Medications  doxycycline (VIBRAMYCIN) 100 mg in dextrose 5 % 250 mL IVPB (100 mg Intravenous New Bag/Given 12/06/16 1501)  sodium chloride 0.9 %  bolus 1,000 mL (1,000 mLs Intravenous New Bag/Given 12/06/16 1407)  ondansetron (ZOFRAN) injection 4 mg (4 mg Intravenous Given 12/06/16 1408)  iopamidol (ISOVUE-300) 61 % injection 100 mL (100 mLs Intravenous Contrast Given 12/06/16 1448)     Initial Impression / Assessment and Plan / ED Course  I have reviewed the triage vital signs and the nursing notes.  Pertinent labs & imaging results that were available during my care of the patient were reviewed by me and considered in my medical decision making (see chart for details).     Gina Campbell is a 21 y.o. female here with flank pain, dizziness, abdominal pain. Recently here for heroin overdose but has positive marijuana, cocaine as well. Consider pyelo given positive urine from recent visit vs vasospasms from cocaine. EKG unremarkable. Will get labs, repeat UA, CT ab/pel to look for pyelo. Will give doxycycline as it will treat UTI and possible mild L hand cellulitis.   3:12 PM WBC 7. UA + UTI again. CT showed no obvious renal abscess but there is RLL pneumonia. Given doxycycline. Not hypoxic  and no vomiting in the ED. Doesn't meet inpatient admission criteria. Will dc home with doxycycline, which will cover for pneumonia, cellulitis, pyelo.    Final Clinical Impressions(s) / ED Diagnoses   Final diagnoses:  Non-intractable vomiting with nausea, unspecified vomiting type  Other chest pain  Pyelonephritis  Cellulitis of hand, left    New Prescriptions New Prescriptions   DOXYCYCLINE (VIBRAMYCIN) 100 MG CAPSULE    Take 1 capsule (100 mg total) by mouth 2 (two) times daily. One po bid x 7 days   METOCLOPRAMIDE (REGLAN) 10 MG TABLET    Take 1 tablet (10 mg total) by mouth every 8 (eight) hours as needed for nausea.     Charlynne Pander, MD 12/06/16 [phone]

## 2016-12-06 NOTE — ED Notes (Signed)
Patient transported to CT 

## 2016-12-06 NOTE — ED Notes (Signed)
POCT preg at bedside negative

## 2016-12-06 NOTE — ED Notes (Addendum)
This RN went to check pts running infusions.  Pump was off, when this RN asked pts significant other who had turned pump off he replied that he did. He stated that it kept beeping. When questioned why he did not inform RN when pumped alarmed, he was unable to produce sufficient answer.  Pt remained asleep during disscusion

## 2016-12-06 NOTE — ED Triage Notes (Signed)
Seen through ED on Sunday night and treated for Heroin OD.  Today patient c/o fatigue, weakness, and vomiting x 1 day.  Seen by PCP yesterday for same complaints.

## 2016-12-06 NOTE — Discharge Instructions (Signed)
Stay hydrated.   Take reglan as needed for nausea and vomiting.   Avoid using drugs.   Take doxycycline as it will treat for urinary tract infection and your hand infection and pneumonia.   See your doctor  Return to ER if you have severe abdominal pain, vomiting, fever, worse hand swelling.

## 2016-12-07 LAB — URINE CULTURE: Culture: >=100000 — AB

## 2016-12-08 NOTE — Progress Notes (Signed)
ED Antimicrobial Stewardship Positive Culture Follow Up   Gina Campbell is an 21 y.o. female who [facility] with a chief complaint of  Chief Complaint  Patient presents with  . Drug Overdose    Recent Results (from the past 720 hour(s))  Urine culture     Status: Abnormal   Collection Time: 12/04/16 11:49 PM  Result Value Ref Range Status   Specimen Description URINE, RANDOM  Final   Special Requests NONE  Final   Culture >=100,000 COLONIES/mL ESCHERICHIA COLI (A)  Final   Report Status 12/07/2016 FINAL  Final   Organism ID, Bacteria ESCHERICHIA COLI (A)  Final      Susceptibility   Escherichia coli - MIC*    AMPICILLIN <=2 SENSITIVE Sensitive     CEFAZOLIN <=4 SENSITIVE Sensitive     CEFTRIAXONE <=1 SENSITIVE Sensitive     CIPROFLOXACIN <=0.25 SENSITIVE Sensitive     GENTAMICIN <=1 SENSITIVE Sensitive     IMIPENEM <=0.25 SENSITIVE Sensitive     NITROFURANTOIN <=16 SENSITIVE Sensitive     TRIMETH/SULFA <=20 SENSITIVE Sensitive     AMPICILLIN/SULBACTAM <=2 SENSITIVE Sensitive     PIP/TAZO <=4 SENSITIVE Sensitive     Extended ESBL NEGATIVE Sensitive     * >=100,000 COLONIES/mL ESCHERICHIA COLI    Patient was seen in the ED on 5/13 due to drug overdose. Patient had a UA taken which may have been contaminated with 6-30 squamous epithelial cells. UCx collected which showed >100,000 colonies of E. coli. Patient discharged on Bactrim which was sensitive to the E. coli. Patient presented to the ED on 5/15 due to nausea and emesis. Patient has hand cellulitis and it was noted that she was not taking her bactrim. Patient sent home on doxycycline which has variable coverage of E. coli.  Tried calling the patient to see if she is experiencing any UTI symptoms, however, phone is out of service. Called PCP and sent over culture (Fax # [phone]) to see if they are able to get a hold of the patient.    Delsa BernKelly m Fuhrmann, PharmD 12/08/2016, 1:50 PM

## 2016-12-29 ENCOUNTER — Emergency Department
Admission: EM | Admit: 2016-12-29 | Discharge: 2016-12-29 | Disposition: A | Discharge status: Home / Self Care | Payer: Medicaid Other | Attending: Emergency Medicine | Admitting: Emergency Medicine

## 2016-12-29 ENCOUNTER — Encounter: Payer: Self-pay | Admitting: Emergency Medicine

## 2016-12-29 ENCOUNTER — Emergency Department: Payer: Medicaid Other

## 2016-12-29 DIAGNOSIS — N8302 Follicular cyst of left ovary: Secondary | ICD-10-CM | POA: Insufficient documentation

## 2016-12-29 DIAGNOSIS — R112 Nausea with vomiting, unspecified: Secondary | ICD-10-CM | POA: Diagnosis not present

## 2016-12-29 DIAGNOSIS — M545 Low back pain: Secondary | ICD-10-CM | POA: Diagnosis present

## 2016-12-29 DIAGNOSIS — R1032 Left lower quadrant pain: Secondary | ICD-10-CM | POA: Insufficient documentation

## 2016-12-29 DIAGNOSIS — N83202 Unspecified ovarian cyst, left side: Secondary | ICD-10-CM

## 2016-12-29 DIAGNOSIS — Z79899 Other long term (current) drug therapy: Secondary | ICD-10-CM | POA: Insufficient documentation

## 2016-12-29 LAB — CBC
HEMATOCRIT: 39.4 % (ref 35.0–47.0)
Hemoglobin: 13 g/dL (ref 12.0–16.0)
MCH: 27.6 pg (ref 26.0–34.0)
MCHC: 33 g/dL (ref 32.0–36.0)
MCV: 83.8 fL (ref 80.0–100.0)
PLATELETS: 214 10*3/uL (ref 150–440)
RBC: 4.71 MIL/uL (ref 3.80–5.20)
RDW: 14.2 % (ref 11.5–14.5)
WBC: 9.8 10*3/uL (ref 3.6–11.0)

## 2016-12-29 LAB — COMPREHENSIVE METABOLIC PANEL
ALBUMIN: 4 g/dL (ref 3.5–5.0)
ALK PHOS: 69 U/L (ref 38–126)
ALT: 12 U/L — ABNORMAL LOW (ref 14–54)
AST: 22 U/L (ref 15–41)
Anion gap: 6 (ref 5–15)
BILIRUBIN TOTAL: 0.9 mg/dL (ref 0.3–1.2)
BUN: 11 mg/dL (ref 6–20)
CALCIUM: 9.1 mg/dL (ref 8.9–10.3)
CO2: 26 mmol/L (ref 22–32)
Chloride: 106 mmol/L (ref 101–111)
Creatinine, Ser: 0.81 mg/dL (ref 0.44–1.00)
GFR calc Af Amer: >60 mL/min (ref >60)
GFR calc non Af Amer: >60 mL/min (ref >60)
GLUCOSE: 102 mg/dL — AB (ref 65–99)
Potassium: 3.4 mmol/L — ABNORMAL LOW (ref 3.5–5.1)
SODIUM: 138 mmol/L (ref 135–145)
Total Protein: 7.8 g/dL (ref 6.5–8.1)

## 2016-12-29 LAB — CHLAMYDIA/NGC RT PCR (ARMC ONLY)
Chlamydia Tr: NOT DETECTED
N gonorrhoeae: NOT DETECTED

## 2016-12-29 LAB — WET PREP, GENITAL
Clue Cells Wet Prep HPF POC: NONE SEEN
SPERM: NONE SEEN
TRICH WET PREP: NONE SEEN
WBC WET PREP: NONE SEEN
Yeast Wet Prep HPF POC: NONE SEEN

## 2016-12-29 LAB — URINALYSIS, ROUTINE W REFLEX MICROSCOPIC
BACTERIA UA: NONE SEEN
Glucose, UA: NEGATIVE mg/dL
Hgb urine dipstick: NEGATIVE
Ketones, ur: NEGATIVE mg/dL
Nitrite: NEGATIVE
PH: 5 (ref 5.0–8.0)
Protein, ur: NEGATIVE mg/dL
RBC / HPF: NONE SEEN RBC/hpf (ref 0–5)
SPECIFIC GRAVITY, URINE: 1.03 (ref 1.005–1.030)

## 2016-12-29 LAB — HCG, QUANTITATIVE, PREGNANCY: hCG, Beta Chain, Quant, S: 1 m[IU]/mL (ref <5)

## 2016-12-29 LAB — POCT PREGNANCY, URINE: Preg Test, Ur: NEGATIVE

## 2016-12-29 MED ORDER — ONDANSETRON HCL 4 MG/2ML IJ SOLN
4.0000 mg | Freq: Once | INTRAMUSCULAR | Status: AC
  Administered 2016-12-29: 4 mg via INTRAVENOUS
  Filled 2016-12-29: qty 2

## 2016-12-29 MED ORDER — SODIUM CHLORIDE 0.9 % IV BOLUS (SEPSIS)
1000.0000 mL | Freq: Once | INTRAVENOUS | Status: AC
  Administered 2016-12-29: 1000 mL via INTRAVENOUS

## 2016-12-29 MED ORDER — KETOROLAC TROMETHAMINE 30 MG/ML IJ SOLN
15.0000 mg | Freq: Once | INTRAMUSCULAR | Status: AC
  Administered 2016-12-29: 15 mg via INTRAVENOUS
  Filled 2016-12-29: qty 1

## 2016-12-29 NOTE — ED Triage Notes (Signed)
Patient c/o back pain that radiates to the left abdomen. Patient states she has a history of kidney problems, but no stones. Patient states she also was vomiting last night.

## 2016-12-29 NOTE — ED Notes (Signed)
Pt presents with back pain/abdominal pain x 1 week, worsening last night. Pt states she thinks it is her kidneys. She has had rhabdo (at 21 y/o) and kidney stones. She states she hasn't felt normal since her overdose on 5/13. Pain rated 9/10. Pt in NAD.

## 2016-12-29 NOTE — ED Provider Notes (Signed)
[facility] Emergency Department Provider Note  ____________________________________________  Time seen: Approximately 3:36 PM  I have reviewed the triage vital signs and the nursing notes.   HISTORY  Chief Complaint Back Pain and Flank Pain   HPI Gina Campbell is a 21 y.o. female with h/o heroin abuse now on vivitrol shots who presents for evaluation of diffuse lower back pain. Patient complaining of 1 week of sharp cramping severe bilateral low back pain radiating to her abdomen worse on the L. Yesterday evening she started to vomit and has had several episodes of nonbloody nonbilious emesis. She continues to vomit today. She reports dysuria that started yesterday evening. No vaginal discharge, no fever but has had chills. No constipation or diarrhea. Patient has had 2 C-sections but no other abdominal surgeries. Currently her pain is 8/10.  Past Medical History:  Diagnosis Date  . Ankle edema   . Obesity   . Recurrent cellulitis of lower leg     Patient Active Problem List   Diagnosis Date Noted  . S/P cesarean section 03/03/2016  . Irregular contractions 02/17/2016  . Labor and delivery, indication for care 02/12/2016  . Decreased fetal movement 10/31/2015  . Cellulitis of left leg 12/05/2014  . SIRS (systemic inflammatory response syndrome) (HCC) 12/05/2014    Past Surgical History:  Procedure Laterality Date  . CESAREAN SECTION     2014  . CESAREAN SECTION N/A 03/03/2016   Procedure: CESAREAN SECTION Baby Girl, 6lb, 5oz.;  Surgeon: Vena AustriaAndreas Staebler, MD;  Location: [facility] ORS;  Service: Obstetrics;  Laterality: N/A;    Prior to Admission medications   Medication Sig Start Date End Date Taking? Authorizing Provider  doxycycline (VIBRAMYCIN) 100 MG capsule Take 1 capsule (100 mg total) by mouth 2 (two) times daily. One po bid x 7 days 12/06/16   Charlynne PanderYao, David Hsienta, MD  etonogestrel (NEXPLANON) 68 MG IMPL implant Inject into the skin.     [provider]  metoCLOPramide (REGLAN) 10 MG tablet Take 1 tablet (10 mg total) by mouth every 8 (eight) hours as needed for nausea. 12/06/16 12/06/17  Charlynne PanderYao, David Hsienta, MD  SUBOXONE 8-2 MG FILM Place 2 strips under the tongue daily. 09/07/16   [provider]  sulfamethoxazole-trimethoprim (BACTRIM DS,SEPTRA DS) 800-160 MG tablet Take 2 tablets by mouth 2 (two) times daily. 12/04/16   Schaevitz, Myra Rudeavid Matthew, MD    Allergies Patient has no known allergies.  Family History  Problem Relation Age of Onset  . Diabetes Father   . Diabetes Other     Social History Social History  Substance Use Topics  . Smoking status: Never Smoker  . Smokeless tobacco: Never Used  . Alcohol use No    Review of Systems  Constitutional: Negative for fever. Eyes: Negative for visual changes. ENT: Negative for sore throat. Neck: No neck pain  Cardiovascular: Negative for chest pain. Respiratory: Negative for shortness of breath. Gastrointestinal: + lower abdominal pain, nausea, and vomiting. No diarrhea. Genitourinary: + dysuria. Musculoskeletal: + b/l low back pain. Skin: Negative for rash. Neurological: Negative for headaches, weakness or numbness. Psych: No SI or HI  ____________________________________________   PHYSICAL EXAM:  VITAL SIGNS: ED Triage Vitals  Enc Vitals Group     BP 12/29/16 1233 117/73     Pulse Rate 12/29/16 1233 84     Resp 12/29/16 1233 18     Temp 12/29/16 1233 98.2 F (36.8 C)     Temp Source 12/29/16 1233 Oral  SpO2 12/29/16 1233 98 %     Weight 12/29/16 1233 200 lb (90.7 kg)     Height 12/29/16 1233 5\' 3"  (1.6 m)     Head Circumference --      Peak Flow --      Pain Score 12/29/16 1256 9     Pain Loc --      Pain Edu? --      Excl. in GC? --     Constitutional: Alert and oriented. Well appearing and in no apparent distress. HEENT:      Head: Normocephalic and atraumatic.         Eyes: Conjunctivae are normal. Sclera is  non-icteric.       Mouth/Throat: Mucous membranes are moist.       Neck: Supple with no signs of meningismus. Cardiovascular: Regular rate and rhythm. No murmurs, gallops, or rubs. 2+ symmetrical distal pulses are present in all extremities. No JVD. Respiratory: Normal respiratory effort. Lungs are clear to auscultation bilaterally. No wheezes, crackles, or rhonchi.  Gastrointestinal: Soft, ttp over the LLQ, non distended with positive bowel sounds. No rebound or guarding. Genitourinary: No CVA tenderness. Pelvic exam: Normal external genitalia, no rashes or lesions. Thick white discharge and mild erythema of the vaginal canal. Os closed. No cervical motion tenderness. No uterine or R adnexal ttp. ttp over the L adnexa. Musculoskeletal: Nontender with normal range of motion in all extremities. No edema, cyanosis, or erythema of extremities. Neurologic: Normal speech and language. Face is symmetric. Moving all extremities. No gross focal neurologic deficits are appreciated. Skin: Skin is warm, dry and intact. No rash noted. Psychiatric: Mood and affect are normal. Speech and behavior are normal.  ____________________________________________   LABS (all labs ordered are listed, but only abnormal results are displayed)  Labs Reviewed  URINALYSIS, ROUTINE W REFLEX MICROSCOPIC - Abnormal; Notable for the following:       Result Value   Color, Urine YELLOW (*)    APPearance CLEAR (*)    Bilirubin Urine SMALL (*)    Leukocytes, UA TRACE (*)    Squamous Epithelial / LPF 6-30 (*)    All other components within normal limits  COMPREHENSIVE METABOLIC PANEL - Abnormal; Notable for the following:    Potassium 3.4 (*)    Glucose, Bld 102 (*)    ALT 12 (*)    All other components within normal limits  WET PREP, GENITAL  CHLAMYDIA/NGC RT PCR ([facility] ONLY)  CBC  HCG, QUANTITATIVE, PREGNANCY  POC URINE PREG, ED  POCT PREGNANCY, URINE    ____________________________________________  EKG  none  ____________________________________________  RADIOLOGY  Pelvic US:  1. Negative for ovarian torsion 2. 3 cm cyst in the left ovary. This is almost certainly benign, and no specific imaging follow up is recommended according to the Society of Radiologists in Ultrasound2010 Consensus Conference Statement (D Lenis Noon et al. Management of Asymptomatic Ovarian and Other Adnexal Cysts Imaged at [place]: Society of Radiologists in Ultrasound Consensus Conference Statement 2010. Radiology 256 (Sept 2010): 161-096.). ____________________________________________   PROCEDURES  Procedure(s) performed: None Procedures Critical Care performed:  None ____________________________________________   INITIAL IMPRESSION / ASSESSMENT AND PLAN / ED COURSE  21 y.o. female with h/o heroin abuse now on vivitrol shots who presents for evaluation of diffuse lower back pain radiating to her abdomen for 7 days now with multiple episodes of nonbloody nonbilious emesis since yesterday evening. Patient is well-appearing, she is in no distress, she has normal vital signs, she has left lower  quadrant tenderness to palpation with no rebound or guarding, no flank tenderness. U pregnant negative. CMP with no acute findings. CBC with no leukocytosis. UA with no blood or evidence of infection. Differential diagnoses including STD versus ovarian pathology versus kidney stone versus diverticulitis. We'll perform pelvic exam and treat with toradol, zofran, and IVF  Clinical Course as of Dec 30 2314  Thu Dec 29, 2016  1812 Ultrasound showing a left-sided 3 cm ovarian cyst with no evidence of torsion, no evidence of tubo-ovarian abscess. Wet prep is negative. GC chlamydia is pending. Patient has requested to hold off antibiotics for GC/ chlamydia for now until the results of GC and Chlamydia are back. I confirmed that I have her correct cell phone number so I'll be able  to contact her if those tests come back positive. Discussed signs and symptoms of ovarian torsion and recommended she returns if these develop. Patient will be discharged home on supportive care at this time.  [CV]    Clinical Course User Index [CV] Nita Sickle, MD    Pertinent labs & imaging results that were available during my care of the patient were reviewed by me and considered in my medical decision making (see chart for details).    ____________________________________________   FINAL CLINICAL IMPRESSION(S) / ED DIAGNOSES  Final diagnoses:  LLQ abdominal pain  Left ovarian cyst      NEW MEDICATIONS STARTED DURING THIS VISIT:  Discharge Medication List as of 12/29/2016  6:15 PM       Note:  This document was prepared using Dragon voice recognition software and may include unintentional dictation errors.    Don Perking, [place], MD 12/29/16 [phone]

## 2016-12-29 NOTE — ED Notes (Signed)
Pt discharged home after verbalizing understanding of discharge instructions; nad noted. 

## 2017-03-10 ENCOUNTER — Ambulatory Visit: Payer: Self-pay | Admitting: Obstetrics and Gynecology

## 2017-03-22 ENCOUNTER — Ambulatory Visit (INDEPENDENT_AMBULATORY_CARE_PROVIDER_SITE_OTHER): Payer: Medicaid Other | Admitting: Obstetrics & Gynecology

## 2017-03-22 ENCOUNTER — Encounter: Payer: Self-pay | Admitting: Obstetrics & Gynecology

## 2017-03-22 VITALS — BP 120/80 | HR 84 | Ht 63.0 in | Wt 238.0 lb

## 2017-03-22 DIAGNOSIS — N83201 Unspecified ovarian cyst, right side: Secondary | ICD-10-CM | POA: Diagnosis not present

## 2017-03-22 DIAGNOSIS — R1031 Right lower quadrant pain: Secondary | ICD-10-CM

## 2017-03-22 NOTE — Patient Instructions (Signed)
Ovarian Cyst An ovarian cyst is a fluid-filled sac that forms on an ovary. The ovaries are small organs that produce eggs in women. Various types of cysts can form on the ovaries. Some may cause symptoms and require treatment. Most ovarian cysts go away on their own, are not cancerous (are benign), and do not cause problems. Common types of ovarian cysts include:  Functional (follicle) cysts. ? Occur during the menstrual cycle, and usually go away with the next menstrual cycle if you do not get pregnant. ? Usually cause no symptoms.  Endometriomas. ? Are cysts that form from the tissue that lines the uterus (endometrium). ? Are sometimes called "chocolate cysts" because they become filled with blood that turns brown. ? Can cause pain in the lower abdomen during intercourse and during your period.  Cystadenoma cysts. ? Develop from cells on the outside surface of the ovary. ? Can get very large and cause lower abdomen pain and pain with intercourse. ? Can cause severe pain if they twist or break open (rupture).  Dermoid cysts. ? Are sometimes found in both ovaries. ? May contain different kinds of body tissue, such as skin, teeth, hair, or cartilage. ? Usually do not cause symptoms unless they get very big.  Theca lutein cysts. ? Occur when too much of a certain hormone (human chorionic gonadotropin) is produced and overstimulates the ovaries to produce an egg. ? Are most common after having procedures used to assist with the conception of a baby (in vitro fertilization).  What are the causes? Ovarian cysts may be caused by:  Ovarian hyperstimulation syndrome. This is a condition that can develop from taking fertility medicines. It causes multiple large ovarian cysts to form.  Polycystic ovarian syndrome (PCOS). This is a common hormonal disorder that can cause ovarian cysts, as well as problems with your period or fertility.  What increases the risk? The following factors may make  you more likely to develop ovarian cysts:  Being overweight or obese.  Taking fertility medicines.  Taking certain forms of hormonal birth control.  Smoking.  What are the signs or symptoms? Many ovarian cysts do not cause symptoms. If symptoms are present, they may include:  Pelvic pain or pressure.  Pain in the lower abdomen.  Pain during sex.  Abdominal swelling.  Abnormal menstrual periods.  Increasing pain with menstrual periods.  How is this diagnosed? These cysts are commonly found during a routine pelvic exam. You may have tests to find out more about the cyst, such as:  Ultrasound.  X-ray of the pelvis.  CT scan.  MRI.  Blood tests.  How is this treated? Many ovarian cysts go away on their own without treatment. Your health care provider may want to check your cyst regularly for 2-3 months to see if it changes. If you are in menopause, it is especially important to have your cyst monitored closely because menopausal women have a higher rate of ovarian cancer. When treatment is needed, it may include:  Medicines to help relieve pain.  A procedure to drain the cyst (aspiration).  Surgery to remove the whole cyst.  Hormone treatment or birth control pills. These methods are sometimes used to help dissolve a cyst.  Follow these instructions at home:  Take over-the-counter and prescription medicines only as told by your health care provider.  Do not drive or use heavy machinery while taking prescription pain medicine.  Get regular pelvic exams and Pap tests as often as told by your health care   provider.  Return to your normal activities as told by your health care provider. Ask your health care provider what activities are safe for you.  Do not use any products that contain nicotine or tobacco, such as cigarettes and e-cigarettes. If you need help quitting, ask your health care provider.  Keep all follow-up visits as told by your health care provider.  This is important. Contact a health care provider if:  Your periods are late, irregular, or painful, or they stop.  You have pelvic pain that does not go away.  You have pressure on your bladder or trouble emptying your bladder completely.  You have pain during sex.  You have any of the following in your abdomen: ? A feeling of fullness. ? Pressure. ? Discomfort. ? Pain that does not go away. ? Swelling.  You feel generally ill.  You become constipated.  You lose your appetite.  You develop severe acne.  You start to have more body hair and facial hair.  You are gaining weight or losing weight without changing your exercise and eating habits.  You think you may be pregnant. Get help right away if:  You have abdominal pain that is severe or gets worse.  You cannot eat or drink without vomiting.  You suddenly develop a fever.  Your menstrual period is much heavier than usual. This information is not intended to replace advice given to you by your health care provider. Make sure you discuss any questions you have with your health care provider. Document Released: 07/11/2005 Document Revised: 01/29/2016 Document Reviewed: 12/13/2015 Elsevier Interactive Patient Education  2018 Elsevier Inc.  

## 2017-03-22 NOTE — Progress Notes (Signed)
  HPI: Patient is a 21 y.o. [id number] who LMP was Patient's last menstrual period was 03/10/2017., presents today for a problem visit.  She complains of recent findings of Right ovarion cyst by Ultrasound - Pelvic Vaginal.  Pt has had symptoms of pain.   Pain RLQ, radiates to back, has associated sx's of nausea and freq BM's, no relievers, about 2 weeks of worsened severity although always seems to have some pain in pelvis, no other context.  Previous evaluation: emergency room visit on 8/2 [facility]), 8/10 ([facility]), 6/7 [facility]). Prior Diagnosis: Left ovarian cyst in June, Right ovarian cyst in August. Previous Treatment: none.  NSAIDs.  Other pain meds as she is on Suboxone for opiate addiction.  PMHx: She  has a past medical history of Ankle edema; Obesity; and Recurrent cellulitis of lower leg. Also,  has a past surgical history that includes Cesarean section and Cesarean section (N/A, 03/03/2016)., family history includes Diabetes in her father and other.,  reports that she has never smoked. She has never used smokeless tobacco. She reports that she uses drugs, including IV. She reports that she does not drink alcohol.  She has a current medication list which includes the following prescription(s): doxycycline, etonogestrel, fluoxetine, metoclopramide, suboxone, and sulfamethoxazole-trimethoprim. Also, has No Known Allergies.  Review of Systems  Constitutional: Negative for chills, fever and malaise/fatigue.  HENT: Negative for congestion, sinus pain and sore throat.   Eyes: Negative for blurred vision and pain.  Respiratory: Negative for cough and wheezing.   Cardiovascular: Negative for chest pain and leg swelling.  Gastrointestinal: Negative for abdominal pain, constipation, diarrhea, heartburn, nausea and vomiting.  Genitourinary: Negative for dysuria, frequency, hematuria and urgency.  Musculoskeletal: Negative for back pain, joint pain, myalgias and neck pain.  Skin: Negative for itching and  rash.  Neurological: Negative for dizziness, tremors and weakness.  Endo/Heme/Allergies: Does not bruise/bleed easily.  Psychiatric/Behavioral: Negative for depression. The patient is not nervous/anxious and does not have insomnia.     Objective: BP 120/80   Pulse 84   Ht 5\' 3"  (1.6 m)   Wt 238 lb (108 kg)   LMP 03/10/2017   BMI 42.16 kg/m  Physical Exam  Constitutional: She is oriented to person, place, and time. She appears well-developed and well-nourished. No distress.  Musculoskeletal: Normal range of motion.  Neurological: She is alert and oriented to person, place, and time.  Skin: Skin is warm and dry.  Psychiatric: She has a normal mood and affect.  Vitals reviewed. Abd- ND, mild T  ASSESSMENT/PLAN:    Visit Diagnoses    Cyst of right ovary    -  Primary   Relevant Orders   US Transvaginal Non-OB        Pelvic Pain, RLQ Pain  Likely cysts will come and go without need for surgery. If recurrent can consider hormone control/ suppression [place] f/u one month  Annamarie Major, MD, [facility], [facility] 03/22/2017  11:48 AM

## 2017-04-21 ENCOUNTER — Ambulatory Visit (INDEPENDENT_AMBULATORY_CARE_PROVIDER_SITE_OTHER): Payer: Medicaid Other

## 2017-04-21 ENCOUNTER — Ambulatory Visit (INDEPENDENT_AMBULATORY_CARE_PROVIDER_SITE_OTHER): Payer: Medicaid Other | Admitting: Obstetrics & Gynecology

## 2017-04-21 ENCOUNTER — Encounter: Payer: Self-pay | Admitting: Obstetrics & Gynecology

## 2017-04-21 ENCOUNTER — Inpatient Hospital Stay: Admission: RE | Admit: 2017-04-21 | Payer: Self-pay | Source: Ambulatory Visit

## 2017-04-21 VITALS — BP 120/80 | HR 67 | Ht 63.0 in | Wt 242.0 lb

## 2017-04-21 DIAGNOSIS — N83201 Unspecified ovarian cyst, right side: Secondary | ICD-10-CM

## 2017-04-21 DIAGNOSIS — R1031 Right lower quadrant pain: Secondary | ICD-10-CM

## 2017-04-21 NOTE — Progress Notes (Signed)
HPI: Abdominal Pain Patient presents for evaluation of abdominal pain. The pain is described as colicky, cramping and shooting, and is 8/10 in intensity. Pain is located in the RLQ area without radiation. Onset was intermittent occurring 6 weeks ago. Symptoms have been unchanged since. Aggravating factors: none. Alleviating factors: none. Associated symptoms: none. The patient denies fever. Risk factors for pelvic/abdominal pain include ovarian cysts. Ultrasound demonstrates cyst seen RIGHT OVARY 3x4cm. These findings are Pelvis persistance of right ovarian cyst  PMHx: She  has a past medical history of Ankle edema; Obesity; and Recurrent cellulitis of lower leg. Also,  has a past surgical history that includes Cesarean section and Cesarean section (N/A, 03/03/2016)., family history includes Diabetes in her father and other.,  reports that she has never smoked. She has never used smokeless tobacco. She reports that she uses drugs, including IV. She reports that she does not drink alcohol.  She has a current medication list which includes the following prescription(s): doxycycline, etonogestrel, fluoxetine, metoclopramide, suboxone, and sulfamethoxazole-trimethoprim. Also, has No Known Allergies.  Review of Systems  Constitutional: Negative for chills, fever and malaise/fatigue.  HENT: Negative for congestion, sinus pain and sore throat.   Eyes: Negative for blurred vision and pain.  Respiratory: Negative for cough and wheezing.   Cardiovascular: Negative for chest pain and leg swelling.  Gastrointestinal: Negative for abdominal pain, constipation, diarrhea, heartburn, nausea and vomiting.  Genitourinary: Negative for dysuria, frequency, hematuria and urgency.  Musculoskeletal: Negative for back pain, joint pain, myalgias and neck pain.  Skin: Negative for itching and rash.  Neurological: Negative for dizziness, tremors and weakness.  Endo/Heme/Allergies: Does not bruise/bleed easily.    Psychiatric/Behavioral: Negative for depression. The patient is not nervous/anxious and does not have insomnia.     Objective: BP 120/80   Pulse 67   Ht  (1.6 m)   Wt 242 lb (109.8 kg)   LMP 04/10/2017   BMI 42.87 kg/m   Physical examination Constitutional NAD, Conversant  HEENT Atraumatic; Op clear with mmm.  Normo-cephalic. Pupils reactive. Anicteric sclerae  Thyroid/Neck Smooth without nodularity or enlargement. Normal ROM.  Neck Supple.  Skin No rashes, lesions or ulceration. Normal palpated skin turgor. No nodularity.  Lungs: Clear to auscultation.No rales or wheezes. Normal Respiratory effort, no retractions.  Heart: NSR.  No murmurs or rubs appreciated. No periferal edema  Abdomen: Soft.  RLQ tender.  No masses.  No HSM. No hernia  Extremities: Moves all appropriately.  Normal ROM for age. No lymphadenopathy.  Neuro: Grossly intact  Psych: Oriented to PPT.  Normal mood. Normal affect.     Pelvic:   Vulva: Normal appearance.  No lesions.  Vagina: No lesions or abnormalities noted.  Support: Normal pelvic support.  Urethra No masses tenderness or scarring.  Meatus Normal size without lesions or prolapse.  Cervix: Normal appearance.  No lesions.  Anus: Normal exam.  No lesions.  Perineum: Normal exam.  No lesions.        Bimanual   Uterus: Normal size.  Non-tender.  Mobile.  AV.  Adnexae: No masses.  Non-tender to palpation.  Cul-de-sac: Negative for abnormality.   Assessment:  Right lower quadrant pain  Cyst of right ovary  Plan laparoscopy w right ovarian cystectomy Alternatives d/w pt, such as expectant management and follow up with ultrasound and analgesia for the pain from the cyst Also advised against pregnancy until >18 mos from last CS (she has had 2, and currently is 13 mos pp); she declines birth control.  I have had a careful discussion with this patient about all the options available and the risk/benefits of each. I have fully informed this  patient that surgery may subject her to a variety of discomforts and risks: She understands that most patients have surgery with little difficulty, but problems can happen ranging from minor to fatal. These include nausea, vomiting, pain, bleeding, infection, poor healing, hernia, or formation of adhesions. Unexpected reactions may occur from any drug or anesthetic given. Unintended injury may occur to other pelvic or abdominal structures such as Fallopian tubes, ovaries, bladder, ureter (tube from kidney to bladder), or bowel. Nerves going from the pelvis to the legs may be injured. Any such injury may require immediate or later additional surgery to correct the problem. Excessive blood loss requiring transfusion is very unlikely but possible. Dangerous blood clots may form in the legs or lungs. Physical and sexual activity will be restricted in varying degrees for an indeterminate period of time but most often 2-6 weeks.  Finally, she understands that it is impossible to list every possible undesirable effect and that the condition for which surgery is done is not always cured or significantly improved, and in rare cases may be even worse.Ample time was given to answer all questions.  Annamarie Major, MD, [facility], [facility] 04/21/2017  10:10 AM

## 2017-04-21 NOTE — Patient Instructions (Signed)
Diagnostic Laparoscopy  A diagnostic laparoscopy is a procedure to diagnose diseases in the abdomen. During the procedure, a thin, lighted, pencil-sized instrument called a laparoscope is inserted into the abdomen through an incision. The laparoscope allows your health care provider to look at the organs inside your body.  Tell a health care provider about:   Any allergies you have.   All medicines you are taking, including vitamins, herbs, eye drops, creams, and over-the-counter medicines.   Any problems you or family members have had with anesthetic medicines.   Any blood disorders you have.   Any surgeries you have had.   Any medical conditions you have.  What are the risks?  Generally, this is a safe procedure. However, problems can occur, which may include:   Infection.   Bleeding.   Damage to other organs.   Allergic reaction to the anesthetics used during the procedure.    What happens before the procedure?   Do not eat or drink anything after midnight on the night before the procedure or as directed by your health care provider.   Ask your health care provider about:  ? Changing or stopping your regular medicines.  ? Taking medicines such as aspirin and ibuprofen. These medicines can thin your blood. Do not take these medicines before your procedure if your health care provider instructs you not to.   Plan to have someone take you home after the procedure.  What happens during the procedure?   You may be given a medicine to help you relax (sedative).   You will be given a medicine to make you sleep (general anesthetic).   Your abdomen will be inflated with a gas. This will make your organs easier to see.   Small incisions will be made in your abdomen.   A laparoscope and other small instruments will be inserted into the abdomen through the incisions.   A tissue sample may be removed from an organ in the abdomen for examination.   The instruments will be removed from the abdomen.   The  gas will be released.   The incisions will be closed with stitches (sutures).  What happens after the procedure?  Your blood pressure, heart rate, breathing rate, and blood oxygen level will be monitored often until the medicines you were given have worn off.  This information is not intended to replace advice given to you by your health care provider. Make sure you discuss any questions you have with your health care provider.  Document Released: 10/17/2000 Document Revised: 11/19/2015 Document Reviewed: 02/21/2014  Elsevier Interactive Patient Education  2018 [employer]

## 2017-04-24 ENCOUNTER — Encounter
Admission: RE | Admit: 2017-04-24 | Discharge: 2017-04-24 | Disposition: A | Discharge status: Home / Self Care | Payer: Self-pay | Source: Ambulatory Visit | Attending: Obstetrics & Gynecology | Admitting: Obstetrics & Gynecology

## 2017-04-24 HISTORY — DX: Other psychoactive substance abuse, uncomplicated: F19.10

## 2017-04-24 HISTORY — DX: Elevated blood-pressure reading, without diagnosis of hypertension: R03.0

## 2017-04-24 NOTE — Patient Instructions (Signed)
  Your procedure is scheduled on: 04-25-17 Report to Same Day Surgery 2nd floor medical mall [facility] Entrance-take elevator on left to 2nd floor.  Check in with surgery information desk.) To find out your arrival time please call [phone] between 1PM - 3PM on 04-24-17  Remember: Instructions that are not followed completely may result in serious medical risk, up to and including death, or upon the discretion of your surgeon and anesthesiologist your surgery may need to be rescheduled.    _x___ 1. Do not eat food after midnight the night before your procedure. You may drink clear liquids up to 2 hours before you are scheduled to arrive at the hospital for your procedure.  Do not drink clear liquids within 2 hours of your scheduled arrival to the hospital.  Clear liquids include  --Water or Apple juice without pulp  --Clear carbohydrate beverage such as ClearFast or Gatorade  --Black Coffee or Clear Tea (No milk, no creamers, do not add anything to  the coffee or Tea Type 1 and type 2 diabetics should only drink water.  No gum chewing or hard candies.     __x__ 2. No Alcohol for 24 hours before or after surgery.   __x__3. No Smoking for 24 prior to surgery.   ____  4. Bring all medications with you on the day of surgery if instructed.    __x__ 5. Notify your doctor if there is any change in your medical condition     (cold, fever, infections).     Do not wear jewelry, make-up, hairpins, clips or nail polish.  Do not wear lotions, powders, or perfumes. You may wear deodorant.  Do not shave 48 hours prior to surgery. Men may shave face and neck.  Do not bring valuables to the hospital.    [facility] is not responsible for any belongings or valuables.               Contacts, dentures or bridgework may not be worn into surgery.  Leave your suitcase in the car. After surgery it may be brought to your room.  For patients admitted to the hospital, discharge time is determined by  your treatment team.   Patients discharged the day of surgery will not be allowed to drive home.  You will need someone to drive you home and stay with you the night of your procedure.    Please read over the following fact sheets that you were given:      _x___ TAKE THE FOLLOWING MEDICATION THE MORNING OF SURGERY WITH A SMALL SIP OF WATER. These include:  1. PROZAC  2.  3.  4.  5.  6.  ____Fleets enema or Magnesium Citrate as directed.   _x___ Use CHG Soap or sage wipes as directed on instruction sheet   ____ Use inhalers on the day of surgery and bring to hospital day of surgery  ____ Stop Metformin and Janumet 2 days prior to surgery.    ____ Take 1/2 of usual insulin dose the night before surgery and none on the morning surgery.   ____ Follow recommendations from Cardiologist, Pulmonologist or PCP regarding stopping Aspirin, Coumadin, Plavix ,Eliquis, Effient, or Pradaxa, and Pletal.  X____Stop Anti-inflammatories such as Advil, Aleve, Ibuprofen, Motrin, Naproxen, Naprosyn, Goodies powders or aspirin products NOW-OK to take Tylenol    ____ Stop supplements until after surgery.     ____ Bring C-Pap to the hospital.

## 2017-04-25 ENCOUNTER — Ambulatory Visit: Admission: RE | Admit: 2017-04-25 | Payer: Self-pay | Source: Ambulatory Visit | Admitting: Obstetrics & Gynecology

## 2017-04-25 ENCOUNTER — Encounter: Admission: RE | Payer: Self-pay | Source: Ambulatory Visit

## 2017-04-25 SURGERY — EXCISION, CYST, OVARY, LAPAROSCOPIC
Anesthesia: Choice | Laterality: Right

## 2017-04-26 ENCOUNTER — Telehealth: Payer: Self-pay | Admitting: Obstetrics & Gynecology

## 2017-04-26 NOTE — Telephone Encounter (Signed)
Patient left a v/m message to call her. Lmtrc.

## 2017-04-26 NOTE — Telephone Encounter (Signed)
Patient's 04/25/2017 surgery was cancelled due to her Medicaid changing to FPW on 04/24/17, and patient was unable to get this corrected before the surgery. I spoke to the patient again this morning, and she said they are cancelling her Medicaid, and she needs payment options for surgery. I gave her [facility] practice fee for her surgery, as well as the phone# for [facility]. I gave her the cpt code and name of the procedure so she could ask about estimated hospital costs. I suggested she ask about self pay, payment plan, and [employer] options. I also gave the patient the [employer] phone#. Patient will let me know when she is ready to schedule the surgery. Patient has my ext.

## 2017-05-02 NOTE — Telephone Encounter (Signed)
Lmtrc

## 2017-05-08 ENCOUNTER — Ambulatory Visit: Payer: Medicaid Other | Admitting: Obstetrics & Gynecology

## 2017-06-26 IMAGING — US US ART/VEN ABD/PELV/SCROTUM DOPPLER LTD
1 series · 13 of 25 positions shown · non-contrast
Comparison: CT 12/06/2016

CLINICAL DATA: Left lower quadrant pain for 1 week

EXAM:
TRANSABDOMINAL AND TRANSVAGINAL ULTRASOUND OF PELVIS
DOPPLER ULTRASOUND OF OVARIES
TECHNIQUE: Both transabdominal and transvaginal ultrasound examinations of the
pelvis were performed. Transabdominal technique was performed for
global imaging of the pelvis including uterus, ovaries, adnexal
regions, and pelvic cul-de-sac.
It was necessary to proceed with endovaginal exam following the
transabdominal exam to visualize the endometrium and ovaries. Color
and duplex Doppler ultrasound was utilized to evaluate blood flow to
the ovaries.

[Series 1: us art/ven abd/pelv/scrotum doppler ltd · 0.20mm/px · 13 of 145 slices shown]
[im 1/145]
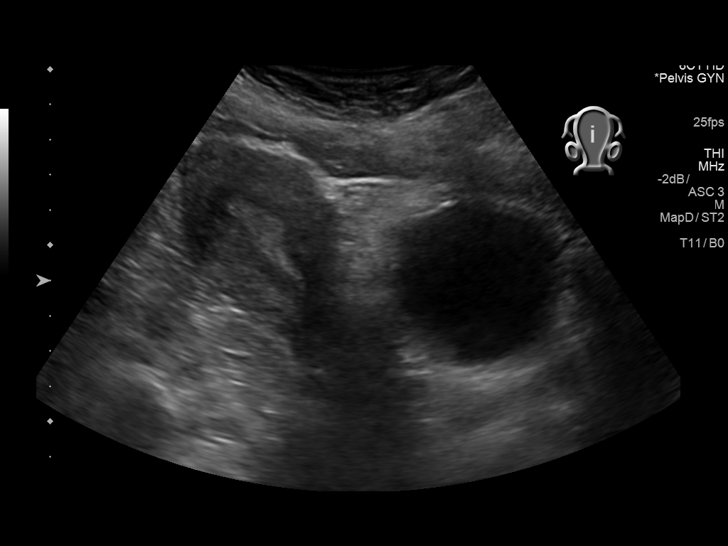
[im 13/145]
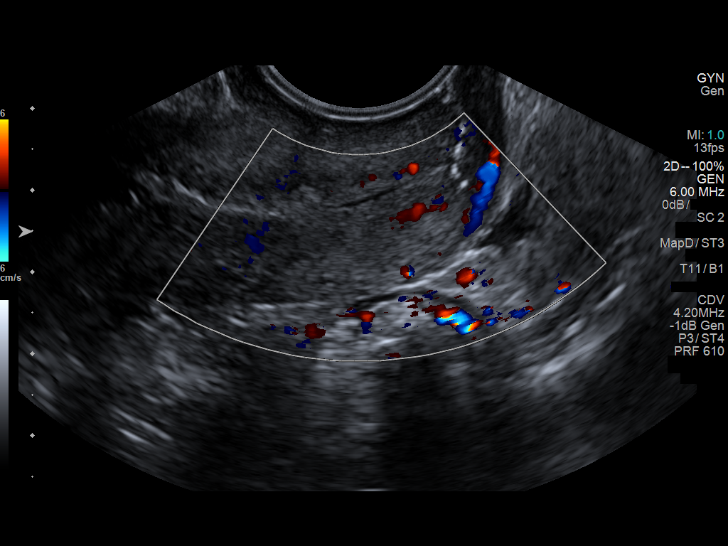
[im 25/145]
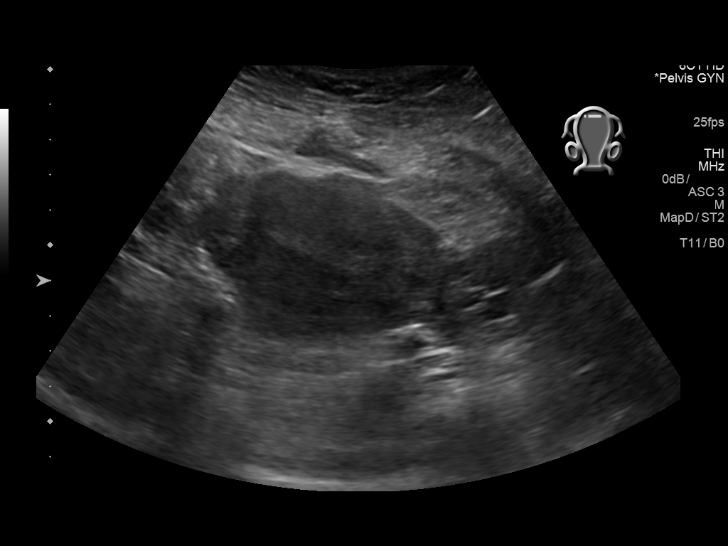
[im 37/145]
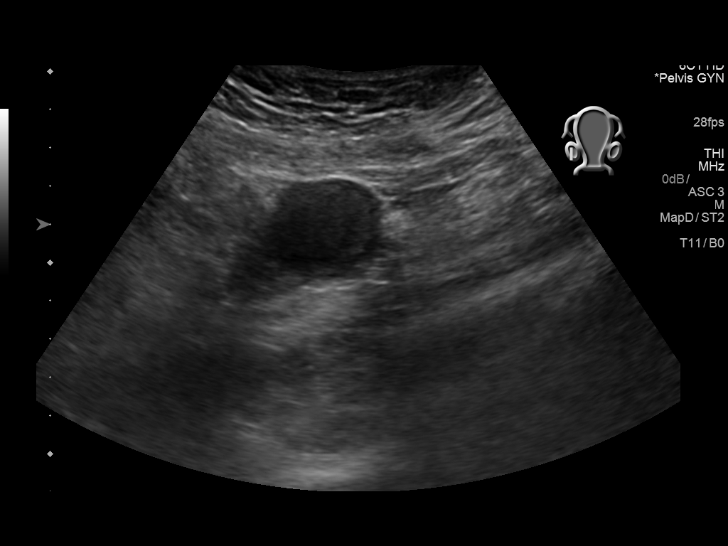
[im 49/145]
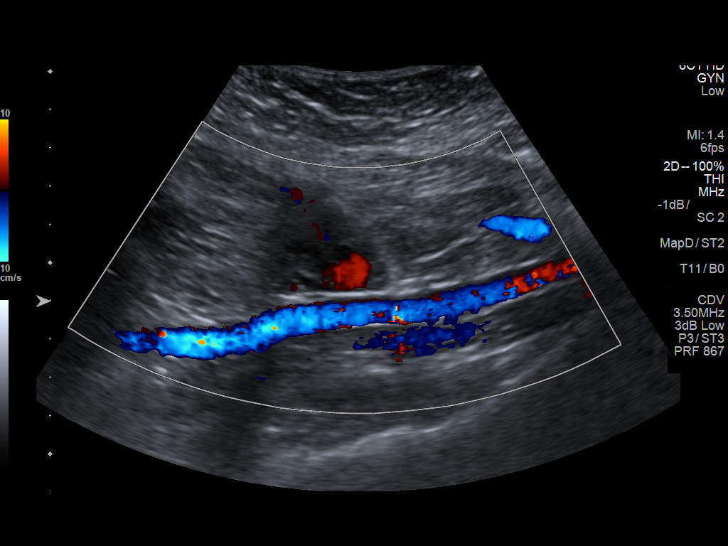
[im 61/145]
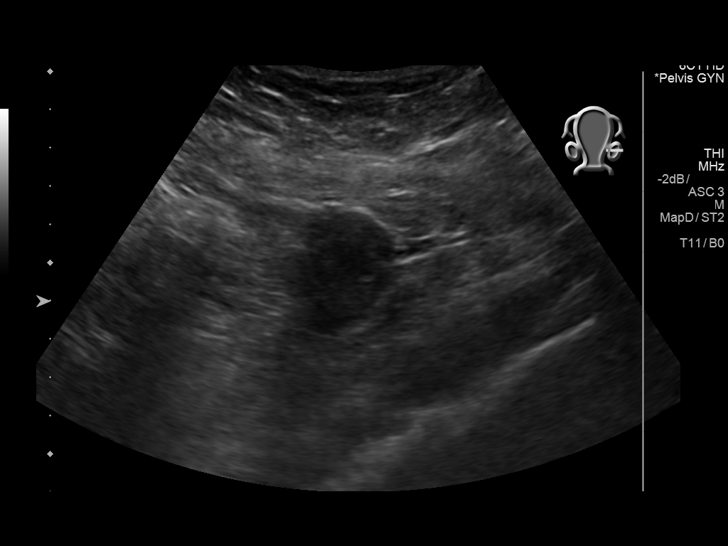
[im 73/145]
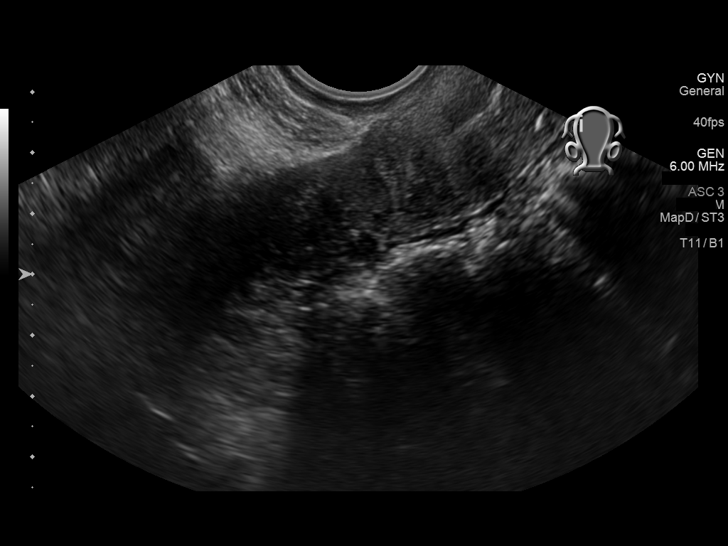
[im 85/145]
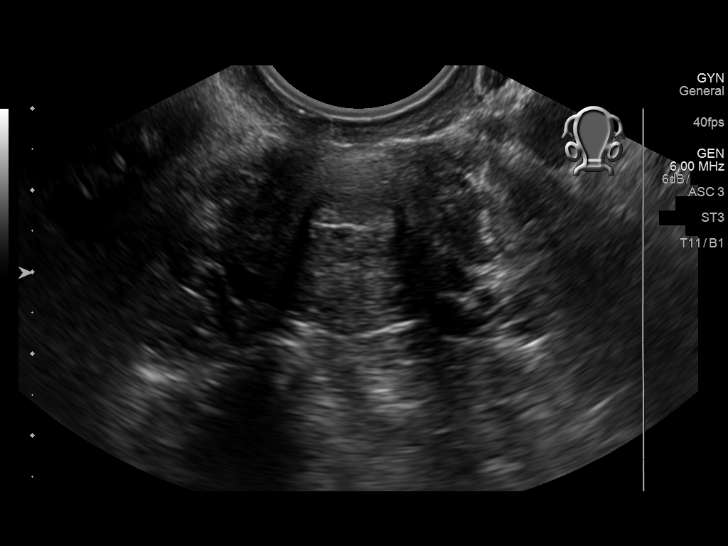
[im 97/145]
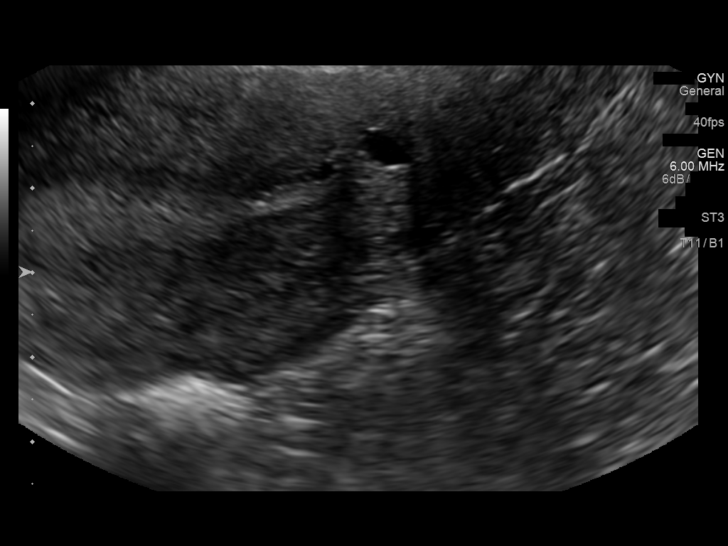
[im 109/145]
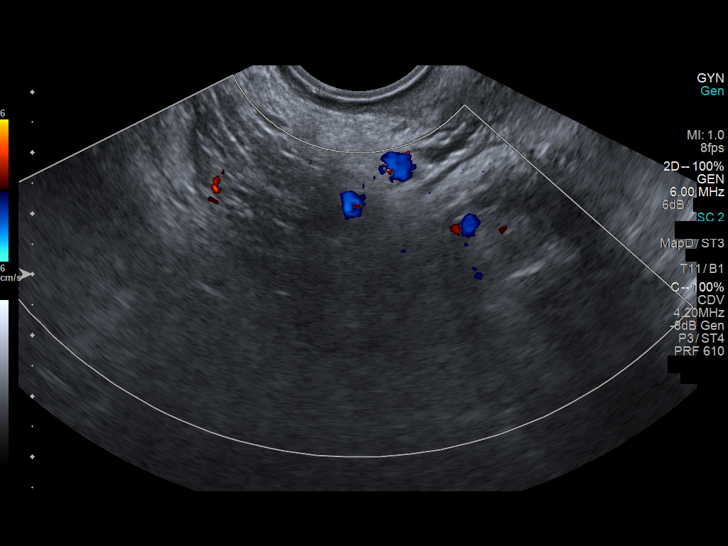
[im 121/145]
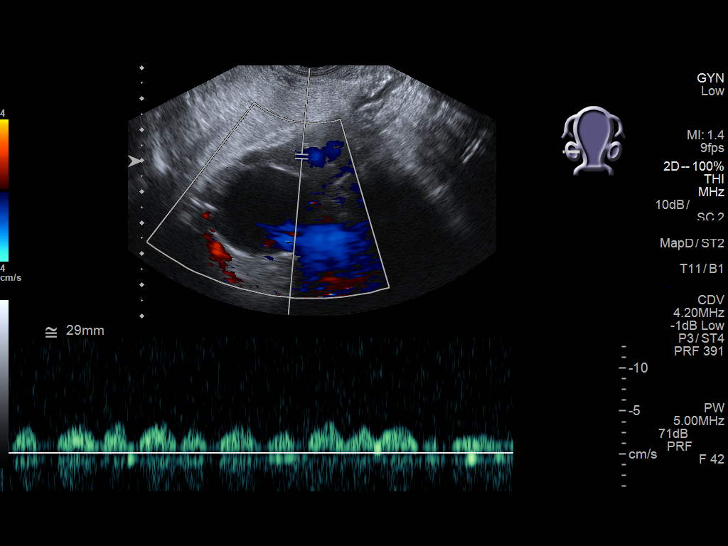
[im 133/145]
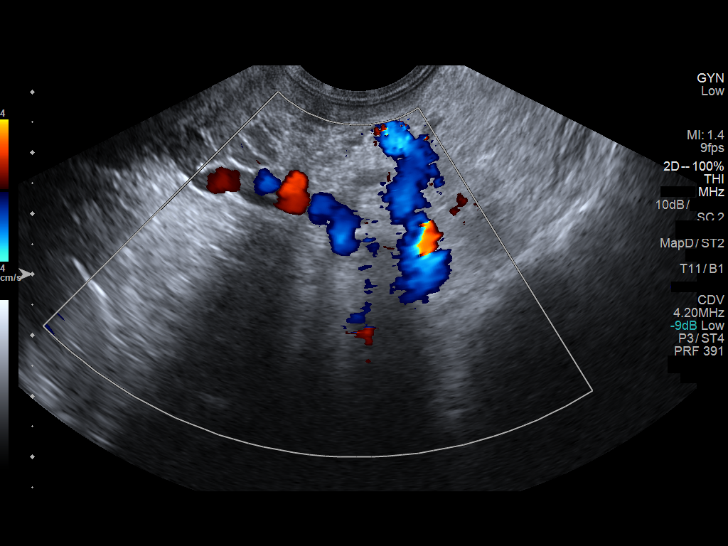
[im 145/145]
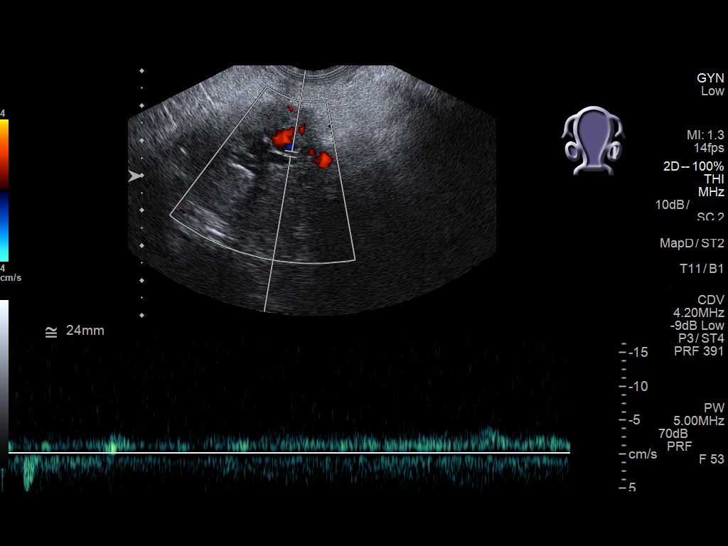

[13 of 25 positions shown; findings below may reference images not displayed]

FINDINGS: Uterus

Measurements: 10.3 x 4.2 x 6.9 cm. No fibroids or other mass
visualized.

Endometrium

Thickness: 12 mm.  No focal abnormality visualized.

Right ovary

Measurements: 5.1 x 2.6 x 3.8 cm. Sonolucent cyst measuring 3 x
x 3.2 cm

Left ovary

Measurements: 3 x 2.5 x 2.5 cm. Normal appearance/no adnexal mass.

Pulsed Doppler evaluation of both ovaries demonstrates normal
low-resistance arterial and venous waveforms.

Other findings

No abnormal free fluid.
IMPRESSION: 1. Negative for ovarian torsion
2. 3 cm cyst in the left ovary. This is almost certainly benign, and
no specific imaging follow up is recommended according to the
Society of Radiologists in SltrasoundIM0M Consensus Conference
Statement (Parker Jiron et al. Management of Asymptomatic Ovarian and
Other Adnexal Cysts Imaged at US: Society of Radiologists in

## 2017-08-21 ENCOUNTER — Other Ambulatory Visit: Payer: Self-pay

## 2017-08-21 ENCOUNTER — Encounter: Payer: Self-pay | Admitting: Emergency Medicine

## 2017-08-21 ENCOUNTER — Emergency Department: Payer: Medicaid Other

## 2017-08-21 ENCOUNTER — Emergency Department
Admission: EM | Admit: 2017-08-21 | Discharge: 2017-08-21 | Disposition: A | Discharge status: Home / Self Care | Payer: Medicaid Other | Attending: Emergency Medicine | Admitting: Emergency Medicine

## 2017-08-21 DIAGNOSIS — Y9384 Activity, sleeping: Secondary | ICD-10-CM | POA: Insufficient documentation

## 2017-08-21 DIAGNOSIS — Y998 Other external cause status: Secondary | ICD-10-CM | POA: Insufficient documentation

## 2017-08-21 DIAGNOSIS — Z79899 Other long term (current) drug therapy: Secondary | ICD-10-CM | POA: Insufficient documentation

## 2017-08-21 DIAGNOSIS — Y92003 Bedroom of unspecified non-institutional (private) residence as the place of occurrence of the external cause: Secondary | ICD-10-CM | POA: Insufficient documentation

## 2017-08-21 DIAGNOSIS — S62663A Nondisplaced fracture of distal phalanx of left middle finger, initial encounter for closed fracture: Secondary | ICD-10-CM

## 2017-08-21 MED ORDER — NAPROXEN 500 MG PO TABS: 500.0000 mg | ORAL_TABLET | Freq: Two times a day (BID) | ORAL | 0 refills | Status: DC

## 2017-08-21 MED ORDER — ACETAMINOPHEN-CODEINE #3 300-30 MG PO TABS: 1.0000 | ORAL_TABLET | Freq: Four times a day (QID) | ORAL | 0 refills | Status: DC | PRN

## 2017-08-21 NOTE — Discharge Instructions (Signed)
Follow-up with your primary care doctor or Dr. Martha ClanKrasinski for your fractured finger.  Ice and elevation to reduce swelling and pain.  Wear finger splint for protection.  Naproxen 500 mg twice daily for pain and inflammation and Tylenol No. 3 if needed for moderate pain.  You will need to follow-up with your primary care provider for any continued pain medication.
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
## 2018-01-18 ENCOUNTER — Encounter: Payer: Self-pay | Admitting: Emergency Medicine

## 2018-01-18 ENCOUNTER — Emergency Department
Admission: EM | Admit: 2018-01-18 | Discharge: 2018-01-18 | Disposition: A | Discharge status: Home / Self Care | Payer: Medicaid Other | Attending: Emergency Medicine | Admitting: Emergency Medicine

## 2018-01-18 DIAGNOSIS — F1510 Other stimulant abuse, uncomplicated: Secondary | ICD-10-CM

## 2018-01-18 DIAGNOSIS — F1994 Other psychoactive substance use, unspecified with psychoactive substance-induced mood disorder: Secondary | ICD-10-CM

## 2018-01-18 DIAGNOSIS — F1110 Opioid abuse, uncomplicated: Secondary | ICD-10-CM | POA: Insufficient documentation

## 2018-01-18 DIAGNOSIS — F1410 Cocaine abuse, uncomplicated: Secondary | ICD-10-CM | POA: Insufficient documentation

## 2018-01-18 DIAGNOSIS — F1920 Other psychoactive substance dependence, uncomplicated: Secondary | ICD-10-CM | POA: Insufficient documentation

## 2018-01-18 DIAGNOSIS — Z79899 Other long term (current) drug therapy: Secondary | ICD-10-CM | POA: Insufficient documentation

## 2018-01-18 LAB — COMPREHENSIVE METABOLIC PANEL
ALBUMIN: 3.5 g/dL (ref 3.5–5.0)
ALT: 19 U/L (ref 0–44)
AST: 31 U/L (ref 15–41)
Alkaline Phosphatase: 61 U/L (ref 38–126)
Anion gap: 8 (ref 5–15)
BUN: 11 mg/dL (ref 6–20)
CHLORIDE: 106 mmol/L (ref 98–111)
CO2: 24 mmol/L (ref 22–32)
CREATININE: 0.73 mg/dL (ref 0.44–1.00)
Calcium: 8.5 mg/dL — ABNORMAL LOW (ref 8.9–10.3)
GFR calc Af Amer: >60 mL/min (ref >60)
GFR calc non Af Amer: >60 mL/min (ref >60)
GLUCOSE: 105 mg/dL — AB (ref 70–99)
POTASSIUM: 3.3 mmol/L — AB (ref 3.5–5.1)
SODIUM: 138 mmol/L (ref 135–145)
Total Bilirubin: 0.4 mg/dL (ref 0.3–1.2)
Total Protein: 7.5 g/dL (ref 6.5–8.1)

## 2018-01-18 LAB — CBC WITH DIFFERENTIAL/PLATELET
BASOS ABS: 0.1 10*3/uL (ref 0–0.1)
Basophils Relative: 1 %
Eosinophils Absolute: 0.1 10*3/uL (ref 0–0.7)
Eosinophils Relative: 2 %
HCT: 39.8 % (ref 35.0–47.0)
Hemoglobin: 13.2 g/dL (ref 12.0–16.0)
LYMPHS ABS: 1.1 10*3/uL (ref 1.0–3.6)
LYMPHS PCT: 14 %
MCH: 29.5 pg (ref 26.0–34.0)
MCHC: 33.2 g/dL (ref 32.0–36.0)
MCV: 88.9 fL (ref 80.0–100.0)
MONO ABS: 0.9 10*3/uL (ref 0.2–0.9)
MONOS PCT: 11 %
NEUTROS ABS: 6 10*3/uL (ref 1.4–6.5)
Neutrophils Relative %: 72 %
Platelets: 242 10*3/uL (ref 150–440)
RBC: 4.47 MIL/uL (ref 3.80–5.20)
RDW: 13.6 % (ref 11.5–14.5)
WBC: 8.3 10*3/uL (ref 3.6–11.0)

## 2018-01-18 LAB — ETHANOL

## 2018-01-18 NOTE — ED Triage Notes (Signed)
Pt reports wants detox from drugs until she can figure out a rehab. Pt states that everywhere she calls they have no beds. Pt states she uses, met and cocaine. Last used 2 days ago. Pt denies SI/HI
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
## 2018-01-18 NOTE — ED Notes (Signed)
Patient assigned to appropriate care area   Introduced self to pt  Patient oriented to unit/care area: Informed that, for their safety, care areas are designed for safety and visiting and phone hours explained to patient. Patient verbalizes understanding, and verbal contract for safety obtained. Environment secured  Pt appropriate and cooperative.  Pt discussed that she wants detox from drugs (meth) until she can figure out a rehab. Pt states that everywhere she calls they have no beds. Pt states she uses, met and cocaine.Patient stated she was scared because she began shooting up the drugs, and she realized that she was becoming a drug addict. Last used 2 days ago. Pt denies SI/HI

## 2018-01-18 NOTE — ED Provider Notes (Addendum)
[facility] Emergency Department Provider Note   ____________________________________________    I have reviewed the triage vital signs and the nursing notes.   HISTORY  Chief Complaint Drug Problem     HPI Gina Campbell is a 22 y.o. female who reports a history of drug abuse, she is here for help with detox.  She denies physical complaints.  No fevers or chills.  No chest pain nausea or vomiting.  States she has called other facilities but no one has any availability.   Past Medical History:  Diagnosis Date  . Ankle edema   . Elevated blood pressure reading    NO DX OF HTN  . Obesity   . Recurrent cellulitis of lower leg   . 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 Substance induced mood disorder ([facility]) 01/18/2018  . S/P cesarean section 03/03/2016  . Irregular contractions 02/17/2016  . Cellulitis of left leg 12/05/2014  . SIRS (systemic inflammatory response syndrome) (HCC) 12/05/2014    Past Surgical History:  Procedure Laterality Date  . CESAREAN SECTION     2014  . CESAREAN SECTION N/A 03/03/2016   Procedure: CESAREAN SECTION Baby Girl, 6lb, 5oz.;  Surgeon: Vena Austria, MD;  Location: [facility] ORS;  Service: Obstetrics;  Laterality: N/A;    Prior to Admission medications   Medication Sig Start Date End Date Taking? Authorizing Provider  acetaminophen-codeine (TYLENOL #3) 300-30 MG tablet Take 1 tablet by mouth every 6 (six) hours as needed for moderate pain. 08/21/17   Tommi Rumps, PA-C  etonogestrel (NEXPLANON) 68 MG IMPL implant Inject into the skin.    [provider]  FLUoxetine (PROZAC) 20 MG capsule Take 20 mg by mouth every morning.     [provider]  naproxen (NAPROSYN) 500 MG tablet Take 1 tablet (500 mg total) by mouth 2 (two) times daily with a meal. 08/21/17   Summers, Rhonda L,  PA-C  SUBOXONE 8-2 MG FILM Place 1 strip under the tongue 2 (two) times daily.  09/07/16   [provider]     Allergies Patient has no known allergies.  Family History  Problem Relation Age of Onset  . Diabetes Father   . Diabetes Other     Social History Social History   Tobacco Use  . Smoking status: Never Smoker  . Smokeless tobacco: Never Used  Substance Use Topics  . Alcohol use: No  . Drug use: Yes    Types: IV, Cocaine, Heroin    Comment: last used heroin early Monday (May 14).    Review of Systems  Constitutional: No fever/chills Eyes: No visual changes.  ENT: No sore throat. Cardiovascular: Denies chest pain. Respiratory: Denies shortness of breath. Gastrointestinal: No abdominal pain.   Genitourinary: Negative for dysuria. Musculoskeletal: Negative for back pain. Skin: Negative for rash. Neurological: Negative for headaches or weakness   ____________________________________________   PHYSICAL EXAM:  VITAL SIGNS: ED Triage Vitals  Enc Vitals Group     BP 01/18/18 1137 121/83     Pulse Rate 01/18/18 1137 88     Resp 01/18/18 1137 18     Temp 01/18/18 1137 97.6 F (36.4 C)     Temp Source 01/18/18 1137 Oral     SpO2 01/18/18 1137 97 %     Weight 01/18/18 1124 90.7 kg (200 lb)  Height 01/18/18 1124 1.6 m (5\' 3" )     Head Circumference --      Peak Flow --      Pain Score 01/18/18 1124 8     Pain Loc --      Pain Edu? --      Excl. in GC? --     Constitutional: Alert and oriented. No acute distress. Pleasant and interactive Eyes: Conjunctivae are normal.   Nose: No congestion/rhinnorhea. Mouth/Throat: Mucous membranes are moist.   Neck:  Painless ROM Cardiovascular: Normal rate, regular rhythm.  Good peripheral circulation. Respiratory: Normal respiratory effort.  No retractions. Gastrointestinal: Soft and nontender. No distention.   Genitourinary: deferred Musculoskeletal: No lower extremity tenderness nor edema.  Warm and  well perfused Neurologic:  Normal speech and language. No gross focal neurologic deficits are appreciated.  Skin:  Skin is warm, dry and intact. No rash noted.   ____________________________________________   LABS (all labs ordered are listed, but only abnormal results are displayed)  Labs Reviewed  COMPREHENSIVE METABOLIC PANEL - Abnormal; Notable for the following components:      Result Value   Potassium 3.3 (*)    Glucose, Bld 105 (*)    Calcium 8.5 (*)    All other components within normal limits  ETHANOL  CBC WITH DIFFERENTIAL/PLATELET  URINE DRUG SCREEN, QUALITATIVE ([facility] ONLY)  POC URINE PREG, ED   ____________________________________________  EKG  None ____________________________________________  RADIOLOGY  None ____________________________________________   PROCEDURES  Procedure(s) performed: No  Procedures   Critical Care performed: No ____________________________________________   INITIAL IMPRESSION / ASSESSMENT AND PLAN / ED COURSE  Pertinent labs & imaging results that were available during my care of the patient were reviewed by me and considered in my medical decision making (see chart for details).  Patient well-appearing in no acute distress.  Have consulted [facility] to evaluate for detox programs   Apparently family expressed to patient advocate that they are worried about suicidality and the patient.  Patient evaluated by Dr. Toni Amendlapacs, he reports she is safe for discharge.  Outpatient detox resources provided by [facility]    ____________________________________________   FINAL CLINICAL IMPRESSION(S) / ED DIAGNOSES  Final diagnoses:  Polysubstance (excluding opioids) dependence (HCC)        Note:  This document was prepared using Dragon voice recognition software and may include unintentional dictation errors.    Jene EveryKinner, Devarius Nelles, MD 01/18/18 1454    Jene EveryKinner, Daymon Hora, MD 01/18/18 [phone]
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
## 2018-01-18 NOTE — BH Assessment (Addendum)
This Clinical research associatewriter [facility] [place] [phone]) and spoke with Lupita LeashDonna who stated the earliest client can be assessed is next Tuesday, July 2nd, however patient must be a [facility] resident.

## 2018-01-18 NOTE — BH Assessment (Addendum)
Dr. Shary Keylapac's patient recommends patient be discharged with outpatient mental health/substance abuse resources.   This Clinical research associatewriter provided patient with [facility] and [facility] outpatient mental health resources.

## 2018-01-18 NOTE — ED Notes (Signed)
Vitals taken. Labs drawn

## 2019-04-23 DIAGNOSIS — Z8632 Personal history of gestational diabetes: Secondary | ICD-10-CM

## 2019-04-24 ENCOUNTER — Ambulatory Visit: Payer: Self-pay

## 2019-12-16 ENCOUNTER — Emergency Department
Admission: EM | Admit: 2019-12-16 | Discharge: 2019-12-16 | Disposition: A | Discharge status: Home / Self Care | Payer: Self-pay | Attending: Emergency Medicine | Admitting: Emergency Medicine

## 2019-12-16 ENCOUNTER — Emergency Department: Payer: Self-pay

## 2019-12-16 ENCOUNTER — Encounter: Payer: Self-pay | Admitting: Emergency Medicine

## 2019-12-16 ENCOUNTER — Other Ambulatory Visit: Payer: Self-pay

## 2019-12-16 DIAGNOSIS — N899 Noninflammatory disorder of vagina, unspecified: Secondary | ICD-10-CM | POA: Insufficient documentation

## 2019-12-16 DIAGNOSIS — N939 Abnormal uterine and vaginal bleeding, unspecified: Secondary | ICD-10-CM | POA: Insufficient documentation

## 2019-12-16 DIAGNOSIS — A549 Gonococcal infection, unspecified: Secondary | ICD-10-CM

## 2019-12-16 DIAGNOSIS — Z5321 Procedure and treatment not carried out due to patient leaving prior to being seen by health care provider: Secondary | ICD-10-CM | POA: Insufficient documentation

## 2019-12-16 DIAGNOSIS — R102 Pelvic and perineal pain unspecified side: Secondary | ICD-10-CM

## 2019-12-16 DIAGNOSIS — A6004 Herpesviral vulvovaginitis: Secondary | ICD-10-CM

## 2019-12-16 DIAGNOSIS — Z79899 Other long term (current) drug therapy: Secondary | ICD-10-CM | POA: Insufficient documentation

## 2019-12-16 DIAGNOSIS — N760 Acute vaginitis: Secondary | ICD-10-CM

## 2019-12-16 DIAGNOSIS — N3001 Acute cystitis with hematuria: Secondary | ICD-10-CM

## 2019-12-16 DIAGNOSIS — A749 Chlamydial infection, unspecified: Secondary | ICD-10-CM

## 2019-12-16 LAB — WET PREP, GENITAL
Sperm: NONE SEEN
Trich, Wet Prep: NONE SEEN
Yeast Wet Prep HPF POC: NONE SEEN

## 2019-12-16 LAB — CBC
HCT: 37 % (ref 36.0–46.0)
Hemoglobin: 12 g/dL (ref 12.0–15.0)
MCH: 28.9 pg (ref 26.0–34.0)
MCHC: 32.4 g/dL (ref 30.0–36.0)
MCV: 89.2 fL (ref 80.0–100.0)
Platelets: 203 10*3/uL (ref 150–400)
RBC: 4.15 MIL/uL (ref 3.87–5.11)
RDW: 13 % (ref 11.5–15.5)
WBC: 7.2 10*3/uL (ref 4.0–10.5)
nRBC: 0 % (ref 0.0–0.2)

## 2019-12-16 LAB — URINALYSIS, COMPLETE (UACMP) WITH MICROSCOPIC
Bilirubin Urine: NEGATIVE
Glucose, UA: NEGATIVE mg/dL
Ketones, ur: NEGATIVE mg/dL
Nitrite: POSITIVE — AB
Protein, ur: 30 mg/dL — AB
RBC / HPF: >50 RBC/hpf — ABNORMAL HIGH (ref 0–5)
Specific Gravity, Urine: 1.024 (ref 1.005–1.030)
WBC, UA: >50 WBC/hpf — ABNORMAL HIGH (ref 0–5)
pH: 7 (ref 5.0–8.0)

## 2019-12-16 LAB — COMPREHENSIVE METABOLIC PANEL
ALT: 12 U/L (ref 0–44)
AST: 18 U/L (ref 15–41)
Albumin: 3.5 g/dL (ref 3.5–5.0)
Alkaline Phosphatase: 54 U/L (ref 38–126)
Anion gap: 6 (ref 5–15)
BUN: 12 mg/dL (ref 6–20)
CO2: 25 mmol/L (ref 22–32)
Calcium: 8.4 mg/dL — ABNORMAL LOW (ref 8.9–10.3)
Chloride: 108 mmol/L (ref 98–111)
Creatinine, Ser: 0.75 mg/dL (ref 0.44–1.00)
GFR calc Af Amer: >60 mL/min (ref >60)
GFR calc non Af Amer: >60 mL/min (ref >60)
Glucose, Bld: 131 mg/dL — ABNORMAL HIGH (ref 70–99)
Potassium: 4 mmol/L (ref 3.5–5.1)
Sodium: 139 mmol/L (ref 135–145)
Total Bilirubin: 0.4 mg/dL (ref 0.3–1.2)
Total Protein: 7.3 g/dL (ref 6.5–8.1)

## 2019-12-16 LAB — POC URINE PREG, ED
Preg Test, Ur: NEGATIVE
Preg Test, Ur: NEGATIVE

## 2019-12-16 LAB — CHLAMYDIA/NGC RT PCR (ARMC ONLY)
Chlamydia Tr: DETECTED — AB
N gonorrhoeae: DETECTED — AB

## 2019-12-16 MED ORDER — SODIUM CHLORIDE 0.9 % IV BOLUS
1000.0000 mL | Freq: Once | INTRAVENOUS | Status: AC
  Administered 2019-12-16: 1000 mL via INTRAVENOUS

## 2019-12-16 MED ORDER — SODIUM CHLORIDE 0.9 % IV SOLN
1.0000 g | Freq: Once | INTRAVENOUS | Status: AC
  Administered 2019-12-16: 1 g via INTRAVENOUS
  Filled 2019-12-16: qty 10

## 2019-12-16 MED ORDER — AZITHROMYCIN 500 MG PO TABS
500.0000 mg | ORAL_TABLET | Freq: Once | ORAL | Status: AC
  Administered 2019-12-16: 500 mg via ORAL
  Filled 2019-12-16: qty 1

## 2019-12-16 MED ORDER — IOHEXOL 300 MG/ML  SOLN
100.0000 mL | Freq: Once | INTRAMUSCULAR | Status: AC | PRN
  Administered 2019-12-16: 100 mL via INTRAVENOUS
  Filled 2019-12-16: qty 100

## 2019-12-16 MED ORDER — LIDOCAINE 4 % EX CREA
TOPICAL_CREAM | Freq: Once | CUTANEOUS | Status: DC
  Filled 2019-12-16: qty 5

## 2019-12-16 MED ORDER — METRONIDAZOLE 500 MG PO TABS
500.0000 mg | ORAL_TABLET | Freq: Once | ORAL | Status: AC
  Administered 2019-12-16: 500 mg via ORAL
  Filled 2019-12-16: qty 1

## 2019-12-16 MED ORDER — METRONIDAZOLE 500 MG PO TABS: 500.0000 mg | ORAL_TABLET | Freq: Two times a day (BID) | ORAL | 0 refills | Status: AC

## 2019-12-16 MED ORDER — AZITHROMYCIN 500 MG PO TABS
500.0000 mg | ORAL_TABLET | Freq: Once | ORAL | Status: AC
  Administered 2019-12-16: 500 mg via ORAL

## 2019-12-16 MED ORDER — ONDANSETRON HCL 4 MG/2ML IJ SOLN
4.0000 mg | Freq: Once | INTRAMUSCULAR | Status: AC
  Administered 2019-12-16: 4 mg via INTRAVENOUS
  Filled 2019-12-16: qty 2

## 2019-12-16 MED ORDER — KETOROLAC TROMETHAMINE 30 MG/ML IJ SOLN
30.0000 mg | Freq: Once | INTRAMUSCULAR | Status: AC
  Administered 2019-12-16: 30 mg via INTRAVENOUS
  Filled 2019-12-16: qty 1

## 2019-12-16 MED ORDER — DOXYCYCLINE HYCLATE 50 MG PO CAPS: 100.0000 mg | ORAL_CAPSULE | Freq: Two times a day (BID) | ORAL | 0 refills | Status: AC

## 2019-12-16 MED ORDER — VALACYCLOVIR HCL 500 MG PO TABS
1000.0000 mg | ORAL_TABLET | Freq: Once | ORAL | Status: AC
  Administered 2019-12-16: 1000 mg via ORAL
  Filled 2019-12-16: qty 2

## 2019-12-16 MED ORDER — CEPHALEXIN 500 MG PO CAPS
500.0000 mg | ORAL_CAPSULE | Freq: Three times a day (TID) | ORAL | 0 refills | Status: AC
Start: 2019-12-16 — End: 2019-12-23

## 2019-12-16 MED ORDER — OXYCODONE-ACETAMINOPHEN 5-325 MG PO TABS
1.0000 | ORAL_TABLET | Freq: Once | ORAL | Status: AC
  Administered 2019-12-16: 1 via ORAL
  Filled 2019-12-16: qty 1

## 2019-12-16 MED ORDER — VALACYCLOVIR HCL 500 MG PO TABS: 500.0000 mg | ORAL_TABLET | Freq: Two times a day (BID) | ORAL | 0 refills | Status: AC

## 2019-12-16 NOTE — ED Notes (Signed)
Lab called regarding receiving HSV swabs, per Lab tech Anderson, swabs received. Will receive in computer as soon as possible.

## 2019-12-16 NOTE — ED Triage Notes (Signed)
Patient states that she has a blisters to her vaginal area that started yesterday. Patient reports that she started having vaginal bleeding today that is heavier then normal. Patient reports that it is not her normal time for her period.

## 2019-12-16 NOTE — ED Provider Notes (Signed)
[facility] Emergency Department Provider Note  ____________________________________________  Time seen: Approximately 7:44 AM  I have reviewed the triage vital signs and the nursing notes.   HISTORY  Chief Complaint Vaginal Bleeding    HPI DEEANNE Campbell is a 24 y.o. female that presents to the emergency department for evaluation of vaginal bleeding and blisters around vagina since last night.  Patient states that she had a normal menstrual cycle at the beginning of the month.  Patient has never had blisters before.  They are painful.  No fever, nausea, vomiting.   Past Medical History:  Diagnosis Date  . Ankle edema   . Elevated blood pressure reading    NO DX OF HTN  . Obesity   . Recurrent cellulitis of lower leg   . 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 Substance induced mood disorder ([place]) 01/18/2018  . S/P cesarean section 03/03/2016  . Cellulitis of left leg 12/05/2014  . SIRS (systemic inflammatory response syndrome) ([place]) 12/05/2014  . History of gestational diabetes 09/04/2014  . Rhabdomyolysis 09/04/2014    Past Surgical History:  Procedure Laterality Date  . CESAREAN SECTION     2014  . CESAREAN SECTION N/A 03/03/2016   Procedure: CESAREAN SECTION Baby Girl, 6lb, 5oz.;  Surgeon: Malachy Mood, MD;  Location: [facility] ORS;  Service: Obstetrics;  Laterality: N/A;    Prior to Admission medications   Medication Sig Start Date End Date Taking? Authorizing Provider  cephALEXin (KEFLEX) 500 MG capsule Take 1 capsule (500 mg total) by mouth 3 (three) times daily for 7 days. 12/16/19 12/23/19  Laban Emperor, PA-C  doxycycline (VIBRAMYCIN) 50 MG capsule Take 2 capsules (100 mg total) by mouth 2 (two) times daily for 10 days. 12/16/19 12/26/19  Laban Emperor, PA-C  etonogestrel (NEXPLANON) 68 MG IMPL implant Inject into the skin.     [provider]  FLUoxetine (PROZAC) 20 MG capsule Take 20 mg by mouth every morning.     [provider]  metroNIDAZOLE (FLAGYL) 500 MG tablet Take 1 tablet (500 mg total) by mouth 2 (two) times daily for 7 days. 12/16/19 12/23/19  Laban Emperor, PA-C  SUBOXONE 8-2 MG FILM Place 1 strip under the tongue 2 (two) times daily.  09/07/16   [provider]  valACYclovir (VALTREX) 500 MG tablet Take 1 tablet (500 mg total) by mouth 2 (two) times daily for 5 days. 12/16/19 12/21/19  Laban Emperor, PA-C    Allergies Patient has no known allergies.  Family History  Problem Relation Age of Onset  . Diabetes Father   . Diabetes Other     Social History Social History   Tobacco Use  . Smoking status: Never Smoker  . Smokeless tobacco: Never Used  Substance Use Topics  . Alcohol use: No  . Drug use: Not Currently    Types: IV, Cocaine, Heroin     Review of Systems  Constitutional: No fever/chills Respiratory: No SOB. Gastrointestinal: Positive for low abdominal discomfort.  No nausea, no vomiting.  Genitourinary: Negative for dysuria. Musculoskeletal: Negative for musculoskeletal pain. Skin: Negative for rash, abrasions, lacerations, ecchymosis. Neurological: Negative for headaches   ____________________________________________   PHYSICAL EXAM:  VITAL SIGNS: ED Triage Vitals  Enc Vitals Group     BP 12/16/19 0639 113/73     Pulse Rate 12/16/19 0639 (!) 104  Resp 12/16/19 0639 18     Temp 12/16/19 0639 98.7 F (37.1 C)     Temp Source 12/16/19 0639 Oral     SpO2 12/16/19 0639 98 %     Weight 12/16/19 0625 200 lb (90.7 kg)     Height 12/16/19 0625 5\' 3"  (1.6 m)     Head Circumference --      Peak Flow --      Pain Score 12/16/19 0625 10     Pain Loc --      Pain Edu? --      Excl. in GC? --      Constitutional: Alert and oriented. Well appearing and in no acute distress. Eyes: Conjunctivae are normal. PERRL. EOMI. Head:  Atraumatic. ENT:      Ears:      Nose: No congestion/rhinnorhea.      Mouth/Throat: Mucous membranes are moist.  Neck: No stridor.  Cardiovascular: Normal rate, regular rhythm.  Good peripheral circulation. Respiratory: Normal respiratory effort without tachypnea or retractions. Lungs CTAB. Good air entry to the bases with no decreased or absent breath sounds. Gastrointestinal: Bowel sounds 4 quadrants. Soft and nontender to palpation. No guarding or rigidity. No palpable masses. No distention.  Pelvic: Few blisters and ulcerations to labia. Blood to vaginal canal. Musculoskeletal: Full range of motion to all extremities. No gross deformities appreciated. Neurologic:  Normal speech and language. No gross focal neurologic deficits are appreciated.  Skin:  Skin is warm, dry and intact. No rash noted. Psychiatric: Mood and affect are normal. Speech and behavior are normal. Patient exhibits appropriate insight and judgement.   ____________________________________________   LABS (all labs ordered are listed, but only abnormal results are displayed)  Labs Reviewed  CHLAMYDIA/NGC RT PCR ([facility] ONLY) - Abnormal; Notable for the following components:      Result Value   Chlamydia Tr DETECTED (*)    N gonorrhoeae DETECTED (*)    All other components within normal limits  WET PREP, GENITAL - Abnormal; Notable for the following components:   Clue Cells Wet Prep HPF POC PRESENT (*)    WBC, Wet Prep HPF POC FEW (*)    All other components within normal limits  URINALYSIS, COMPLETE (UACMP) WITH MICROSCOPIC - Abnormal; Notable for the following components:   Color, Urine YELLOW (*)    APPearance CLOUDY (*)    Hgb urine dipstick LARGE (*)    Protein, ur 30 (*)    Nitrite POSITIVE (*)    Leukocytes,Ua LARGE (*)    RBC / HPF >50 (*)    WBC, UA >50 (*)    Bacteria, UA RARE (*)    All other components within normal limits  HERPES SIMPLEX VIRUS(HSV) DNA BY PCR  POC URINE PREG, ED    ____________________________________________  EKG   ____________________________________________  RADIOLOGY 12/18/19, personally viewed and evaluated these images (plain radiographs) as part of my medical decision making, as well as reviewing the written report by the radiologist.  Lexine Baton Pelvis Complete  Result Date: 12/16/2019 CLINICAL DATA:  Pelvic pain and vaginal bleeding EXAM: TRANSABDOMINAL ULTRASOUND OF PELVIS TECHNIQUE: Transabdominal ultrasound examination of the pelvis was performed including evaluation of the uterus, ovaries, adnexal regions, and pelvic cul-de-sac. Patient declined transvaginal study. COMPARISON:  April 21, 2017 and December 29, 2016 FINDINGS: Uterus Measurements: 8.4 x 3.8 x 5.6 cm = volume: 92.4 mL. No fibroids or other mass visualized. Endometrium Thickness: 4 mm.  No focal abnormality visualized. Right ovary Measurements: 4.6 x 3.5  x 3.9 cm = volume: 33.2 mL. There is a cyst arising in the right adnexal region measuring 4.1 x 3.2 x 3.3 cm, similar in appearance to the 2018 study. No new right-sided pelvic mass. Left ovary Measurements: 3.3 x 2.3 x 2.5 cm = volume: 9.9 mL. Normal appearance/no adnexal mass. Other findings:  No abnormal free fluid. IMPRESSION: Cyst arising from right ovary measuring 4.1 x 3.2 x 3.3 cm, similar to prior study from 2018. This has benign characteristics and is a common finding in premenopausal females. No imaging follow up is required. This follows consensus guidelines: Simple Adnexal Cysts: SRU Consensus Conference Update on Follow-up and Reporting. Radiology 2019; [id number]. Study otherwise unremarkable. Electronically Signed   By: Bretta Bang III M.D.   On: 12/16/2019 09:14   CT ABDOMEN PELVIS W CONTRAST  Result Date: 12/16/2019 CLINICAL DATA:  At pelvic pain, negative pregnancy test, vaginal blisters beginning yesterday, vaginal bleeding today heavier than normal, EXAM: CT ABDOMEN AND PELVIS WITH CONTRAST TECHNIQUE:  Multidetector CT imaging of the abdomen and pelvis was performed using the standard protocol following bolus administration of intravenous contrast. Sagittal and coronal MPR images reconstructed from axial data set. CONTRAST:  OMNIPAQUE IOHEXOL 300 MG/ML  SOLN COMPARISON:  12/06/2016 FINDINGS: Lower chest: Lung bases clear Hepatobiliary: Gallbladder and liver normal appearance Pancreas: Normal appearance Spleen: Normal appearance Adrenals/Urinary Tract: Adrenal glands normal appearance. Tiny BILATERAL nonobstructing renal calculi. Kidneys, ureters, and bladder otherwise normal appearance Stomach/Bowel: Stomach and bowel loops normal appearance. Normal appendix. Vascular/Lymphatic: Vascular structures patent. Aorta normal caliber. Enlarged BILATERAL inguinal lymph nodes up to 15 mm short axis LEFT image 89 and 19 mm RIGHT inguinal image 89. Additionally, borderline enlarged pelvic sidewall nodes LEFT 11 mm image 81 and RIGHT 10 mm image 82. Reproductive: Unremarkable uterus, LEFT ovary, and vagina. RIGHT ovarian cyst 4.1 x 3.2 cm, low attenuation. Other: No free air or free fluid. No hernia. Musculoskeletal: Unremarkable IMPRESSION: RIGHT ovarian cyst 4.1 x 3.2 cm. Tiny BILATERAL nonobstructing renal calculi. Mild nonspecific BILATERAL inguinal and borderline pelvic adenopathy; this could potentially be reactive, recommend correlation with physical exam to ensure resolution to exclude other etiologies. Electronically Signed   By: Ulyses Southward M.D.   On: 12/16/2019 09:58    ____________________________________________    PROCEDURES  Procedure(s) performed:    Procedures    Medications  lidocaine (LMX) 4 % cream (has no administration in time range)  oxyCODONE-acetaminophen (PERCOCET/ROXICET) 5-325 MG per tablet 1 tablet (1 tablet Oral Given 12/16/19 0823)  cefTRIAXone (ROCEPHIN) 1 g in sodium chloride 0.9 % 100 mL IVPB (0 g Intravenous Stopped 12/16/19 0951)  sodium chloride 0.9 % bolus 1,000 mL  (0 mLs Intravenous Stopped 12/16/19 1215)  ondansetron (ZOFRAN) injection 4 mg (4 mg Intravenous Given 12/16/19 0917)  valACYclovir (VALTREX) tablet 1,000 mg (1,000 mg Oral Given 12/16/19 0915)  azithromycin (ZITHROMAX) tablet 500 mg (500 mg Oral Given 12/16/19 0915)  iohexol (OMNIPAQUE) 300 MG/ML solution 100 mL (100 mLs Intravenous Contrast Given 12/16/19 0941)  ketorolac (TORADOL) 30 MG/ML injection 30 mg (30 mg Intravenous Given 12/16/19 1026)  metroNIDAZOLE (FLAGYL) tablet 500 mg (500 mg Oral Given 12/16/19 1026)  azithromycin (ZITHROMAX) tablet 500 mg (500 mg Oral Given 12/16/19 1213)     ____________________________________________   INITIAL IMPRESSION / ASSESSMENT AND PLAN / ED COURSE  Pertinent labs & imaging results that were available during my care of the patient were reviewed by me and considered in my medical decision making (see chart for details).  Review  of the [place] CSRS was performed in accordance of the [facility] prior to dispensing any controlled drugs.  Differential diagnosis includes, but is not limited to, ovarian cyst, ovarian torsion, acute appendicitis, diverticulitis, urinary tract infection/pyelonephritis, endometriosis, bowel obstruction, colitis, renal colic, gastroenteritis, hernia, fibroids, endometriosis, pregnancy related pain including ectopic pregnancy, etc.   Patient's diagnosis is consistent with gonorrhea, chlamydia, cystitis, BV, herpes.  Vital signs and exam are reassuring.  CBC and CMP are unremarkable.  Pregnancy test negative. Gonorrhea and Chlamydia tests are both positive.  Of wet prep is positive for BV.  Urinalysis contributory to cystitis.  Physical exam is concerning for new herpes infection. Swab for culture was sent.  Patient was given IV ceftriaxone, Flagyl, azithromycin, Valtrex in the emergency department.  Patient would not tolerate a pelvic exam.  Pelvic ultrasound shows a right ovarian cyst.  Patient did not tolerate a Doppler pelvic ultrasound to  rule out ovarian torsion.  CT scan negative for additional acute abnormalities.  Patient declines repeat Doppler ultrasound following pain medication to rule out ovarian torsion.  Patient expresses understanding of risks without this ultrasound.  Patient will return to the emergency department for worsening abdominal pain.  Patient declines staying in the hospital. She declines HIV testing. Dr. Tiburcio Pea was updated on patient and will follow up with patient outpatient.  Pain improved following toradol and percocet. Patient will be discharged home with prescriptions for Flagyl, Valtrex, Keflex, doxycycline. Patient is to follow up with gynecology as directed. Patient is given ED precautions to return to the ED for any worsening or new symptoms.  Gina Campbell was evaluated in Emergency Department on 12/16/2019 for the symptoms described in the history of present illness. She was evaluated in the context of the global COVID-19 pandemic, which necessitated consideration that the patient might be at risk for infection with the SARS-CoV-2 virus that causes COVID-19. Institutional protocols and algorithms that pertain to the evaluation of patients at risk for COVID-19 are in a state of rapid change based on information released by regulatory bodies including the CDC and federal and state organizations. These policies and algorithms were followed during the patient's care in the ED.   ____________________________________________  FINAL CLINICAL IMPRESSION(S) / ED DIAGNOSES  Final diagnoses:  Pelvic pain  Vaginal bleeding  Gonorrhea  Chlamydia  Acute cystitis with hematuria  Bacterial vaginosis  Herpes simplex vulvovaginitis      NEW MEDICATIONS STARTED DURING THIS VISIT:  ED Discharge Orders         Ordered    metroNIDAZOLE (FLAGYL) 500 MG tablet  2 times daily     12/16/19 1203    valACYclovir (VALTREX) 500 MG tablet  2 times daily     12/16/19 1203    cephALEXin (KEFLEX) 500 MG capsule  3  times daily     12/16/19 1203    doxycycline (VIBRAMYCIN) 50 MG capsule  2 times daily     12/16/19 1203              This chart was dictated using voice recognition software/Dragon. Despite best efforts to proofread, errors can occur which can change the meaning. Any change was purely unintentional.    Enid Derry, PA-C 12/16/19 1524    Sharyn Creamer, MD 12/16/19 1547

## 2019-12-16 NOTE — ED Notes (Signed)
Pt provided with urine cup, asked to provide urine sample, pt states unable to provide sample due to pain at this time.
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
## 2019-12-16 NOTE — ED Triage Notes (Signed)
Patient with complaint of vaginal bleeding that started tonight that is heavier then her normal period. Patient states that she has blisters to her vaginal area that started yesterday. Patient was here earlier for the same but left prior to being seen.

## 2019-12-16 NOTE — Discharge Instructions (Signed)
You tested positive for gonorrhea, chlamydia, bacterial vaginosis and a urinary tract infection.  I also suspect that you have genital herpes.  Please begin medications as prescribed.  Do not breast-feed while taking medications.  Do not drink alcohol while taking the Flagyl.  Please call gynecology today for a follow-up appointment within 2 days.  Return to the emergency department for worsening of symptoms.  If your abdominal pain changes or worsens, please return to the emergency department for the pelvic ultrasound that we were unable to do and that you did not want repeated.

## 2019-12-16 NOTE — ED Notes (Signed)
Urine and swabs collected by this RN, PA, and EDT. Walked to lab by this Charity fundraiser. Pt taken to [place] at this time.

## 2019-12-17 LAB — HSV DNA BY PCR (REFERENCE LAB)
HSV 1 DNA: POSITIVE — AB
HSV 2 DNA: NEGATIVE

## 2019-12-18 ENCOUNTER — Telehealth: Payer: Self-pay | Admitting: Emergency Medicine

## 2019-12-18 NOTE — Telephone Encounter (Signed)
Called patient to give results of hsv testing.  No answer and no voicemail.

## 2019-12-24 NOTE — Telephone Encounter (Signed)
Called patient again to inform of hsv results. Her mother answered and cherita is not there.  Mom will have her call me back.

## 2020-06-08 ENCOUNTER — Other Ambulatory Visit: Payer: Self-pay

## 2020-06-08 ENCOUNTER — Encounter: Payer: Self-pay | Admitting: *Deleted

## 2020-06-08 ENCOUNTER — Emergency Department: Payer: Self-pay

## 2020-06-08 DIAGNOSIS — N898 Other specified noninflammatory disorders of vagina: Secondary | ICD-10-CM | POA: Insufficient documentation

## 2020-06-08 DIAGNOSIS — F1910 Other psychoactive substance abuse, uncomplicated: Secondary | ICD-10-CM | POA: Insufficient documentation

## 2020-06-08 DIAGNOSIS — N3000 Acute cystitis without hematuria: Secondary | ICD-10-CM | POA: Insufficient documentation

## 2020-06-08 DIAGNOSIS — A599 Trichomoniasis, unspecified: Secondary | ICD-10-CM | POA: Insufficient documentation

## 2020-06-08 DIAGNOSIS — T7421XA Adult sexual abuse, confirmed, initial encounter: Secondary | ICD-10-CM | POA: Insufficient documentation

## 2020-06-08 DIAGNOSIS — Z79899 Other long term (current) drug therapy: Secondary | ICD-10-CM | POA: Insufficient documentation
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
## 2020-06-09 ENCOUNTER — Emergency Department
Admission: EM | Admit: 2020-06-09 | Discharge: 2020-06-09 | Disposition: A | Discharge status: Home / Self Care | Payer: Self-pay | Attending: Emergency Medicine | Admitting: Emergency Medicine

## 2020-06-09 DIAGNOSIS — N898 Other specified noninflammatory disorders of vagina: Secondary | ICD-10-CM

## 2020-06-09 DIAGNOSIS — A599 Trichomoniasis, unspecified: Secondary | ICD-10-CM

## 2020-06-09 DIAGNOSIS — T7421XA Adult sexual abuse, confirmed, initial encounter: Secondary | ICD-10-CM

## 2020-06-09 DIAGNOSIS — N3000 Acute cystitis without hematuria: Secondary | ICD-10-CM

## 2020-06-09 LAB — URINALYSIS, COMPLETE (UACMP) WITH MICROSCOPIC
Bacteria, UA: NONE SEEN
Bilirubin Urine: NEGATIVE
Glucose, UA: NEGATIVE mg/dL
Hgb urine dipstick: NEGATIVE
Ketones, ur: NEGATIVE mg/dL
Nitrite: NEGATIVE
Protein, ur: 30 mg/dL — AB
Specific Gravity, Urine: 1.028 (ref 1.005–1.030)
WBC, UA: >50 WBC/hpf — ABNORMAL HIGH (ref 0–5)
pH: 6 (ref 5.0–8.0)

## 2020-06-09 LAB — URINE CULTURE

## 2020-06-09 LAB — POC URINE PREG, ED

## 2020-06-09 MED ORDER — DROPERIDOL 2.5 MG/ML IJ SOLN
2.5000 mg | Freq: Once | INTRAMUSCULAR | Status: AC
  Administered 2020-06-09: 2.5 mg via INTRAMUSCULAR
  Filled 2020-06-09: qty 2

## 2020-06-09 MED ORDER — IBUPROFEN 600 MG PO TABS
600.0000 mg | ORAL_TABLET | Freq: Once | ORAL | Status: AC
  Administered 2020-06-09: 600 mg via ORAL
  Filled 2020-06-09: qty 1

## 2020-06-09 MED ORDER — ACETAMINOPHEN 500 MG PO TABS
1000.0000 mg | ORAL_TABLET | Freq: Once | ORAL | Status: AC
  Administered 2020-06-09: 1000 mg via ORAL
  Filled 2020-06-09: qty 2

## 2020-06-09 MED ORDER — FOSFOMYCIN TROMETHAMINE 3 G PO PACK
3.0000 g | PACK | Freq: Once | ORAL | Status: AC
  Administered 2020-06-09: 3 g via ORAL
  Filled 2020-06-09: qty 3

## 2020-06-09 MED ORDER — METRONIDAZOLE 500 MG PO TABS
2000.0000 mg | ORAL_TABLET | Freq: Once | ORAL | Status: AC
  Administered 2020-06-09: 2000 mg via ORAL
  Filled 2020-06-09: qty 4

## 2020-06-09 NOTE — SANE Note (Signed)
At approximately 0420, FNE attempted to speak with patient again.  Patient continues to decline to speak with FNE.  FNE spoke with attending provider Dr. Katrinka Blazing.  Dr. Katrinka Blazing informed FNE that patient is now declining all medical services and will be discharged shortly.

## 2020-06-09 NOTE — ED Notes (Signed)
Patient verbalizes understanding of discharge instructions. Opportunity for questioning and answers were provided. Armband removed by staff, pt discharged from ED. Left with bondsman
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
## 2020-06-09 NOTE — SANE Note (Addendum)
FNE arrived to patient room at approximately 0310.  Patient could not remain awake and alert enough to answer questions or sign consent.  FNE attempted to wake patient multiple times.  Awanda Mink, bail bondsman at patient bedside, also attempted to wake patient.  Provider, Adaline Sill, MD, went into patient room.  Patient opened eyes, looked at Dr. Katrinka Blazing and closed her eyes again. Patient had been medicated with 2.5 mg orf droperidol for her anxiety prior to FNE arrival.   FNE informed Dr. Katrinka Blazing, Mr. Providence Lanius, and charge nurse Erie Noe that patient could not be examined in this state.

## 2020-06-09 NOTE — ED Notes (Signed)
Dawn, SANE RN called to update that she is with another pt and that she will be to ED later in AM. Pt and MD updated on this status.
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
## 2020-08-15 ENCOUNTER — Emergency Department
Admission: EM | Admit: 2020-08-15 | Discharge: 2020-08-15 | Disposition: A | Discharge status: Home / Self Care | Payer: Self-pay | Attending: Emergency Medicine | Admitting: Emergency Medicine

## 2020-08-15 ENCOUNTER — Other Ambulatory Visit: Payer: Self-pay

## 2020-08-15 DIAGNOSIS — K047 Periapical abscess without sinus: Secondary | ICD-10-CM | POA: Insufficient documentation

## 2020-08-15 MED ORDER — LIDOCAINE VISCOUS HCL 2 % MT SOLN: 5.0000 mL | Freq: Four times a day (QID) | OROMUCOSAL | 0 refills | Status: DC | PRN

## 2020-08-15 MED ORDER — AMOXICILLIN 500 MG PO CAPS: 500.0000 mg | ORAL_CAPSULE | Freq: Three times a day (TID) | ORAL | 0 refills | Status: DC

## 2020-08-15 NOTE — ED Triage Notes (Signed)
Pt presents via POV c/o lower dental pain x2 days. Reports 33wks OB. Denies fevers.

## 2020-08-15 NOTE — ED Provider Notes (Signed)
[facility] Emergency Department Provider Note   ____________________________________________   Event Date/Time   First MD Initiated Contact with Patient 08/15/20 1357     (approximate)  I have reviewed the triage vital signs and the nursing notes.   HISTORY  Chief Complaint Dental Pain    HPI Gina Campbell is a 25 y.o. female patient complain left lower jaw dental pain for 2 days.  Patient has a fractured left lower molar and noticed increased swelling of the gingiva.  No fever associated complaint.  Patient is [redacted] weeks gestation.  Rates the pain as a 10/10.  Described pain as "achy.  Voices no concerns about gestational state.  No palliative measure for complaint.         Past Medical History:  Diagnosis Date  . Ankle edema   . Elevated blood pressure reading    NO DX OF HTN  . Obesity   . Recurrent cellulitis of lower leg   . 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 Substance induced mood disorder ([facility]) 01/18/2018  . S/P cesarean section 03/03/2016  . Cellulitis of left leg 12/05/2014  . SIRS (systemic inflammatory response syndrome) ([facility]) 12/05/2014  . History of gestational diabetes 09/04/2014  . Rhabdomyolysis 09/04/2014    Past Surgical History:  Procedure Laterality Date  . CESAREAN SECTION     2014  . CESAREAN SECTION N/A 03/03/2016   Procedure: CESAREAN SECTION Baby Girl, 6lb, 5oz.;  Surgeon: Vena Austria, MD;  Location: [facility] ORS;  Service: Obstetrics;  Laterality: N/A;    Prior to Admission medications   Medication Sig Start Date End Date Taking? Authorizing Provider  amoxicillin (AMOXIL) 500 MG capsule Take 1 capsule (500 mg total) by mouth 3 (three) times daily. 08/15/20  Yes Joni Reining, PA-C  lidocaine (XYLOCAINE) 2 % solution Use as directed 5 mLs in the mouth or throat every 6 (six) hours as  needed for mouth pain. For oral swish 08/15/20  Yes Joni Reining, PA-C  etonogestrel (NEXPLANON) 68 MG IMPL implant Inject into the skin.    [provider]  FLUoxetine (PROZAC) 20 MG capsule Take 20 mg by mouth every morning.     [provider]  SUBOXONE 8-2 MG FILM Place 1 strip under the tongue 2 (two) times daily.  09/07/16   [provider]    Allergies Patient has no known allergies.  Family History  Problem Relation Age of Onset  . Diabetes Father   . Diabetes Other     Social History Social History   Tobacco Use  . Smoking status: Never Smoker  . Smokeless tobacco: Never Used  Vaping Use  . Vaping Use: Never used  Substance Use Topics  . Alcohol use: No  . Drug use: Not Currently    Types: IV, Cocaine, Heroin    Review of Systems Constitutional: No fever/chills Eyes: No visual changes. ENT: No sore throat. Cardiovascular: Denies chest pain. Respiratory: Denies shortness of breath. Gastrointestinal: No abdominal pain.  No nausea, no vomiting.  No diarrhea.  No constipation. Genitourinary: Negative for dysuria. Musculoskeletal: Negative for back pain. Skin: Negative for rash. Neurological: Negative for headaches, focal weakness or numbness. Psychiatric:  History of substance abuse.    ____________________________________________   PHYSICAL EXAM:  VITAL SIGNS: ED Triage Vitals  Enc Vitals Group     BP 08/15/20  1349 114/89     Pulse Rate 08/15/20 1349 98     Resp 08/15/20 1349 16     Temp 08/15/20 1349 98.7 F (37.1 C)     Temp Source 08/15/20 1349 Oral     SpO2 08/15/20 1349 100 %     Weight --      Height --      Head Circumference --      Peak Flow --      Pain Score 08/15/20 1347 10     Pain Loc --      Pain Edu? --      Excl. in GC? --    Constitutional: Alert and oriented. Well appearing and in no acute distress. Nose: No congestion/rhinnorhea. Mouth/Throat: Mucous membranes are moist.  Left lower gingival  edema and erythema. Neck: No stridor.  Hematological/Lymphatic/Immunilogical: No cervical lymphadenopathy. Cardiovascular: Normal rate, regular rhythm. Grossly normal heart sounds.  Good peripheral circulation. Respiratory: Normal respiratory effort.  No retractions. Lungs CTAB. Genitourinary: Deferred Skin:  Skin is warm, dry and intact. No rash noted. Psychiatric: Mood and affect are normal. Speech and behavior are normal.  ____________________________________________   LABS (all labs ordered are listed, but only abnormal results are displayed)  Labs Reviewed - No data to display ____________________________________________  EKG   ____________________________________________  RADIOLOGY I, Joni Reining, personally viewed and evaluated these images (plain radiographs) as part of my medical decision making, as well as reviewing the written report by the radiologist.  ED MD interpretation:    Official radiology report(s): No results found.  ____________________________________________   PROCEDURES  Procedure(s) performed (including Critical Care):  Procedures   ____________________________________________   INITIAL IMPRESSION / ASSESSMENT AND PLAN / ED COURSE  As part of my medical decision making, I reviewed the following data within the electronic MEDICAL RECORD NUMBER         Patient presents with dental pain secondary to gingivitis and fractured left lower molar.  Patient given discharge care instructions.  Patient given list of dental clinics for follow-up care.  Patient given prescription for amoxicillin and viscous lidocaine.  Patient advised extra strength Tylenol as needed for pain.      ____________________________________________   FINAL CLINICAL IMPRESSION(S) / ED DIAGNOSES  Final diagnoses:  Dental abscess     ED Discharge Orders         Ordered    amoxicillin (AMOXIL) 500 MG capsule  3 times daily        08/15/20 1400    lidocaine  (XYLOCAINE) 2 % solution  Every 6 hours PRN        08/15/20 1400          *Please note:  [name] was evaluated in Emergency Department on 08/15/2020 for the symptoms described in the history of present illness. She was evaluated in the context of the global COVID-19 pandemic, which necessitated consideration that the patient might be at risk for infection with the SARS-CoV-2 virus that causes COVID-19. Institutional protocols and algorithms that pertain to the evaluation of patients at risk for COVID-19 are in a state of rapid change based on information released by regulatory bodies including the CDC and federal and state organizations. These policies and algorithms were followed during the patient's care in the ED.  Some ED evaluations and interventions may be delayed as a result of limited staffing during and the pandemic.*   Note:  This document was prepared using Dragon voice recognition software and may include unintentional dictation  errors.    Joni Reining, PA-C 08/15/20 1409    Concha Se, MD 08/16/20 [phone]

## 2020-08-15 NOTE — Discharge Instructions (Addendum)
Follow discharge care instruction take medication as directed.  May follow-up from list of dental clinics given your discharge care instruction.  Advised extra strength Tylenol as needed for pain.

## 2020-08-22 ENCOUNTER — Encounter: Payer: Self-pay | Admitting: *Deleted

## 2020-08-22 ENCOUNTER — Inpatient Hospital Stay
Admission: EM | Admit: 2020-08-22 | Discharge: 2020-08-22 | Disposition: A | Discharge status: Home / Self Care | Payer: Self-pay | Attending: Advanced Practice Midwife | Admitting: Advanced Practice Midwife

## 2020-08-22 DIAGNOSIS — O368130 Decreased fetal movements, third trimester, not applicable or unspecified: Secondary | ICD-10-CM | POA: Insufficient documentation

## 2020-08-22 DIAGNOSIS — W000XXA Fall on same level due to ice and snow, initial encounter: Secondary | ICD-10-CM

## 2020-08-22 DIAGNOSIS — Z0389 Encounter for observation for other suspected diseases and conditions ruled out: Secondary | ICD-10-CM

## 2020-08-22 DIAGNOSIS — T148XXA Other injury of unspecified body region, initial encounter: Secondary | ICD-10-CM

## 2020-08-22 DIAGNOSIS — Z3202 Encounter for pregnancy test, result negative: Secondary | ICD-10-CM

## 2020-08-22 DIAGNOSIS — Z3A33 33 weeks gestation of pregnancy: Secondary | ICD-10-CM | POA: Insufficient documentation

## 2020-08-22 LAB — PREGNANCY, URINE: Preg Test, Ur: NEGATIVE
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
## 2020-08-22 NOTE — Progress Notes (Signed)
Pt presented to L&D c/o falling two days ago and has had decreased fetal movement since then.Pt reports seeing [facility] for her care. Pt reports last OB visit was December 10th. She also reports she is scheduled to have a c-section next week. Fetal monitoring attempted without ability to auscultate FHR. Hoover Brunette, CNM notified and to bedside. Bedside ultrasound performed, no fetus present. Ultrasound reviewed by 2 midwives. Pregnancy test sent to lab. Currently awaiting results. Gina Campbell

## 2020-09-29 ENCOUNTER — Emergency Department: Payer: Self-pay

## 2020-09-29 ENCOUNTER — Emergency Department
Admission: EM | Admit: 2020-09-29 | Discharge: 2020-09-29 | Disposition: A | Discharge status: Home / Self Care | Payer: Self-pay | Attending: Emergency Medicine | Admitting: Emergency Medicine

## 2020-09-29 ENCOUNTER — Other Ambulatory Visit: Payer: Self-pay

## 2020-09-29 DIAGNOSIS — M79661 Pain in right lower leg: Secondary | ICD-10-CM

## 2020-09-29 DIAGNOSIS — M25471 Effusion, right ankle: Secondary | ICD-10-CM

## 2020-09-29 DIAGNOSIS — L03115 Cellulitis of right lower limb: Secondary | ICD-10-CM | POA: Insufficient documentation

## 2020-09-29 DIAGNOSIS — M7989 Other specified soft tissue disorders: Secondary | ICD-10-CM

## 2020-09-29 LAB — CBC
HCT: 40.2 % (ref 36.0–46.0)
Hemoglobin: 12.8 g/dL (ref 12.0–15.0)
MCH: 29 pg (ref 26.0–34.0)
MCHC: 31.8 g/dL (ref 30.0–36.0)
MCV: 91 fL (ref 80.0–100.0)
Platelets: 336 10*3/uL (ref 150–400)
RBC: 4.42 MIL/uL (ref 3.87–5.11)
RDW: 12.4 % (ref 11.5–15.5)
WBC: 8.2 10*3/uL (ref 4.0–10.5)
nRBC: 0 % (ref 0.0–0.2)

## 2020-09-29 LAB — LACTIC ACID, PLASMA: Lactic Acid, Venous: 1.6 mmol/L (ref 0.5–1.9)

## 2020-09-29 LAB — COMPREHENSIVE METABOLIC PANEL
ALT: 14 U/L (ref 0–44)
AST: 20 U/L (ref 15–41)
Albumin: 3.6 g/dL (ref 3.5–5.0)
Alkaline Phosphatase: 55 U/L (ref 38–126)
Anion gap: 8 (ref 5–15)
BUN: 13 mg/dL (ref 6–20)
CO2: 27 mmol/L (ref 22–32)
Calcium: 8.8 mg/dL — ABNORMAL LOW (ref 8.9–10.3)
Chloride: 103 mmol/L (ref 98–111)
Creatinine, Ser: 0.72 mg/dL (ref 0.44–1.00)
GFR, Estimated: >60 mL/min (ref >60)
Glucose, Bld: 121 mg/dL — ABNORMAL HIGH (ref 70–99)
Potassium: 4 mmol/L (ref 3.5–5.1)
Sodium: 138 mmol/L (ref 135–145)
Total Bilirubin: 0.6 mg/dL (ref 0.3–1.2)
Total Protein: 7.7 g/dL (ref 6.5–8.1)

## 2020-09-29 MED ORDER — CEPHALEXIN 500 MG PO CAPS
500.0000 mg | ORAL_CAPSULE | Freq: Once | ORAL | Status: AC
  Administered 2020-09-29: 500 mg via ORAL
  Filled 2020-09-29: qty 1

## 2020-09-29 MED ORDER — CEPHALEXIN 500 MG PO CAPS: 500.0000 mg | ORAL_CAPSULE | Freq: Two times a day (BID) | ORAL | 0 refills | Status: AC

## 2020-09-29 NOTE — ED Triage Notes (Addendum)
Pt comes pov with right ankle and calf swelling with pain and knot on back of calf. Denies smoking, travel, birth control, or pregnancy. Pt has had cellulitis of lower extrems before.

## 2020-09-29 NOTE — ED Provider Notes (Signed)
[facility] Emergency Department Provider Note   ____________________________________________    I have reviewed the triage vital signs and the nursing notes.   HISTORY  Chief Complaint Leg Pain     HPI Gina Campbell is a 25 y.o. female with a history as noted below who presents with complaints of swelling redness and burning to her right lower leg, this started 2 days ago.  She reports she has had cellulitis of the left leg before and this feels somewhat similar.  Denies fevers or chills.  No injury to the area.  No history of blood clots.  Has not take anything for this  Past Medical History:  Diagnosis Date  . Ankle edema   . Elevated blood pressure reading    NO DX OF HTN  . Obesity   . Recurrent cellulitis of lower leg   . 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 Labor and delivery, indication for care 08/22/2020  . Urine pregnancy test negative   . Observation for suspected medical conditions   . 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 Substance induced mood disorder (HCC) 01/18/2018  . S/P cesarean section 03/03/2016  . Cellulitis of left leg 12/05/2014  . SIRS (systemic inflammatory response syndrome) (HCC) 12/05/2014  . History of gestational diabetes 09/04/2014  . Rhabdomyolysis 09/04/2014    Past Surgical History:  Procedure Laterality Date  . CESAREAN SECTION     2014  . CESAREAN SECTION N/A 03/03/2016   Procedure: CESAREAN SECTION Baby Girl, 6lb, 5oz.;  Surgeon: Vena Austria, MD;  Location: [facility] ORS;  Service: Obstetrics;  Laterality: N/A;    Prior to Admission medications   Medication Sig Start Date End Date Taking? Authorizing Provider  cephALEXin (KEFLEX) 500 MG capsule Take 1 capsule (500 mg total) by mouth 2 (two) times daily. 09/29/20  Yes Jene Every, MD  SUBOXONE 8-2 MG FILM Place 1 strip under the tongue 2 (two) times daily.   Patient not taking: No sig reported 09/07/16   [provider]     Allergies Patient has no known allergies.  Family History  Problem Relation Age of Onset  . Diabetes Father   . Diabetes Other     Social History Social History   Tobacco Use  . Smoking status: Never Smoker  . Smokeless tobacco: Never Used  Vaping Use  . Vaping Use: Never used  Substance Use Topics  . Alcohol use: No  . Drug use: Not Currently    Types: IV, Cocaine, Heroin    Review of Systems  Constitutional: No fever/chills   Respiratory: Denies shortness of breath. Gastrointestinal: No abdominal pain.  No nausea, no vomiting.   . Musculoskeletal: as above Skin: As above Neurological: Negative for headaches or weakness   ____________________________________________   PHYSICAL EXAM:  VITAL SIGNS: ED Triage Vitals  Enc Vitals Group     BP 09/29/20 0940 120/77     Pulse Rate 09/29/20 0940 96     Resp 09/29/20 0940 16     Temp 09/29/20 0940 (!) 97.5 F (36.4 C)     Temp Source 09/29/20 0940 Oral     SpO2 09/29/20 0940 99 %     Weight 09/29/20 0941 79.4 kg (175 lb)     Height 09/29/20 0941 1.549 m (5\' 1" )     Head Circumference --      Peak Flow --  Pain Score 09/29/20 0941 10     Pain Loc --      Pain Edu? --      Excl. in GC? --     Constitutional: Alert and oriented.   Head: Atraumatic.  No lesion on the scalp where patient is complaining of swelling Nose: No congestion/rhinnorhea.  Cardiovascular: Normal rate, regular rhythm.   Good peripheral circulation. Respiratory: Normal respiratory effort.  No retractions.   Musculoskeletal: Mild erythema and swelling of the right lower extremity, no streaking, no fluctuance, minimal tenderness to palpation, warm and well-perfused Neurologic:  Normal speech and language. No gross focal neurologic deficits are appreciated.  Skin:  Skin is warm, dry and intact. No rash noted. Psychiatric: Mood and affect are normal. Speech  and behavior are normal.  ____________________________________________   LABS (all labs ordered are listed, but only abnormal results are displayed)  Labs Reviewed  COMPREHENSIVE METABOLIC PANEL - Abnormal; Notable for the following components:      Result Value   Glucose, Bld 121 (*)    Calcium 8.8 (*)    All other components within normal limits  CBC  LACTIC ACID, PLASMA   ____________________________________________  EKG  None ____________________________________________  RADIOLOGY  Ultrasound negative for DVT ____________________________________________   PROCEDURES  Procedure(s) performed: No  Procedures   Critical Care performed: No ____________________________________________   INITIAL IMPRESSION / ASSESSMENT AND PLAN / ED COURSE  Pertinent labs & imaging results that were available during my care of the patient were reviewed by me and considered in my medical decision making (see chart for details).  Patient presents with redness burning and some itching to the right lower extremity, exam is consistent with cellulitis.  Lab work is reassuring, normal lactic, normal white blood cell count.  Ultrasound negative for DVT, will start the patient on Keflex close outpatient follow-up recommended, return precautions of any worsening.    ____________________________________________   FINAL CLINICAL IMPRESSION(S) / ED DIAGNOSES  Final diagnoses:  Cellulitis of right lower extremity        Note:  This document was prepared using Dragon voice recognition software and may include unintentional dictation errors.   Jene Every, MD 09/29/20 1248

## 2023-04-14 ENCOUNTER — Encounter (HOSPITAL_BASED_OUTPATIENT_CLINIC_OR_DEPARTMENT_OTHER): Payer: Self-pay | Admitting: Emergency Medicine

## 2023-04-14 ENCOUNTER — Emergency Department (HOSPITAL_BASED_OUTPATIENT_CLINIC_OR_DEPARTMENT_OTHER)
Admission: EM | Admit: 2023-04-14 | Discharge: 2023-04-14 | Disposition: A | Discharge status: Home / Self Care | Payer: 59 | Attending: Emergency Medicine | Admitting: Emergency Medicine

## 2023-04-14 ENCOUNTER — Other Ambulatory Visit: Payer: Self-pay

## 2023-04-14 DIAGNOSIS — H9201 Otalgia, right ear: Secondary | ICD-10-CM | POA: Insufficient documentation

## 2023-04-14 DIAGNOSIS — Z5321 Procedure and treatment not carried out due to patient leaving prior to being seen by health care provider: Secondary | ICD-10-CM | POA: Diagnosis not present

## 2023-04-14 NOTE — ED Triage Notes (Signed)
Pt reports sitting in a class tonight and sneezed and had sudden onset of right ear itching and stabbing pain.  Feels pressure in the ear as well.

## 2023-05-16 ENCOUNTER — Other Ambulatory Visit (HOSPITAL_BASED_OUTPATIENT_CLINIC_OR_DEPARTMENT_OTHER): Payer: Self-pay

## 2023-05-16 MED ORDER — SPIRONOLACTONE 50 MG PO TABS
50.0000 mg | ORAL_TABLET | Freq: Every day | ORAL | 5 refills | Status: DC
  Filled 2023-05-16: qty 90, 90d supply, fill #0

## 2023-05-16 MED ORDER — CEFUROXIME AXETIL 500 MG PO TABS
500.0000 mg | ORAL_TABLET | Freq: Two times a day (BID) | ORAL | 0 refills | Status: AC
  Filled 2023-05-16: qty 20, 10d supply, fill #0
